# Patient Record
Sex: Female | Born: 1990 | Hispanic: Yes | Marital: Married | Smoking: Never smoker
Health system: Southern US, Community
[De-identification: ages and names within clinical notes are randomized; demographics above are authoritative.]

## PROBLEM LIST (undated history)

## (undated) ENCOUNTER — Inpatient Hospital Stay (HOSPITAL_COMMUNITY): Payer: Self-pay

## (undated) DIAGNOSIS — Z789 Other specified health status: Secondary | ICD-10-CM

## (undated) HISTORY — PX: NO PAST SURGERIES: SHX2092

---

## 2012-04-01 ENCOUNTER — Ambulatory Visit: Payer: Self-pay | Admitting: Physician Assistant

## 2012-04-01 VITALS — BP 108/64 | HR 105 | Temp 98.9°F | Resp 16 | Ht 60.25 in | Wt 115.4 lb

## 2012-04-01 DIAGNOSIS — R05 Cough: Secondary | ICD-10-CM

## 2012-04-01 DIAGNOSIS — J069 Acute upper respiratory infection, unspecified: Secondary | ICD-10-CM

## 2012-04-01 DIAGNOSIS — H6120 Impacted cerumen, unspecified ear: Secondary | ICD-10-CM

## 2012-04-01 MED ORDER — BENZONATATE 100 MG PO CAPS: 100.0000 mg | ORAL_CAPSULE | Freq: Three times a day (TID) | ORAL | Status: DC | PRN

## 2012-04-01 NOTE — Progress Notes (Signed)
   Patient ID: Colleen Ballard MRN: [id number], DOB: 1990/12/17, 22 y.o. Date of Encounter: 04/01/2012, 4:08 PM  Primary Physician: No primary provider on file.  Chief Complaint: Left otalgia and tinnitus for 3 days  HPI: 22 y.o. year old female with history below presents with left otalgia, tinnitus, nasal congestion, and cough for 3 days. Afebrile. No chills. She notes some muffled hearing along the left ear. No drainage, discharge, or bleeding from this ear. She does use Q tips every few days. Right ear is asymptomatic. Cough is non productive and not associated with time of day. No wheezing, shortness of breath, or chest pain. Tinnitus is low pitched and not associated with any dizziness or vertigo symptoms.    History reviewed. No pertinent past medical history.   Home Meds: Prior to Admission medications   Not on File    Allergies: No Known Allergies  History   Social History  . Marital Status: Married    Spouse Name: N/A    Number of Children: N/A  . Years of Education: N/A   Occupational History  . Not on file.   Social History Main Topics  . Smoking status: Never Smoker   . Smokeless tobacco: Not on file  . Alcohol Use: No  . Drug Use: No  . Sexually Active: Yes    Birth Control/ Protection: None   Other Topics Concern  . Not on file   Social History Narrative  . No narrative on file     Review of Systems: Constitutional: negative for chills, fever, night sweats, weight changes, or fatigue  HEENT: see above Cardiovascular: negative for chest pain or palpitations Respiratory: positive for cough. negative for hemoptysis, wheezing, or shortness of breath Abdominal: negative for abdominal pain, nausea, or vomiting Dermatological: negative for rash Neurologic: negative for headache, dizziness, or syncope   Physical Exam: Blood pressure 108/64, pulse 105, temperature 98.9 F (37.2 C), temperature source Oral, resp. rate 16, height 5' 0.25" (1.53  m), weight 115 lb 6.4 oz (52.345 kg), last menstrual period 03/27/2012, SpO2 98.00%., Body mass index is 22.35 kg/(m^2). General: Well developed, well nourished, in no acute distress. Head: Normocephalic, atraumatic, eyes without discharge, sclera non-icteric, nares are without discharge. Bilateral auditory canals with cerumen impaction. S/P ear lavage bilateral auditory canals clear. Bilateral TM's are without perforation, pearly grey and translucent with reflective cone of light bilaterally. No signs of AOM. No mastoid TTP. Oral cavity moist, posterior pharynx without exudate, erythema, peritonsillar abscess, or post nasal drip. Uvula midline.  Neck: Supple. No thyromegaly. Full ROM. No lymphadenopathy. Lungs: Clear bilaterally to auscultation without wheezes, rales, or rhonchi. Breathing is unlabored. Heart: RRR with S1 S2. No murmurs, rubs, or gallops appreciated. Msk:  Strength and tone normal for age. Extremities/Skin: Warm and dry. No clubbing or cyanosis. No edema. No rashes or suspicious lesions. Neuro: Alert and oriented X 3. Moves all extremities spontaneously. Gait is normal. CNII-XII grossly in tact. Psych:  Responds to questions appropriately with a normal affect.   Ear lavage performed by: Eileen Stanford   ASSESSMENT AND PLAN:  22 y.o. year old female with cerumen impaction s/p ear lavage and URI. -Resolved -Ear lavage per above -Tessalon Perles 100 mg 1 po tid prn cough #30 no RF  -RTC prn  Signed, Eula Listen, PA-C 04/01/2012 4:08 PM

## 2013-03-02 LAB — OB RESULTS CONSOLE ABO/RH: RH Type: POSITIVE

## 2013-03-02 LAB — OB RESULTS CONSOLE ANTIBODY SCREEN: Antibody Screen: NEGATIVE

## 2013-03-02 LAB — OB RESULTS CONSOLE RPR: RPR: NONREACTIVE

## 2013-03-02 LAB — OB RESULTS CONSOLE HEPATITIS B SURFACE ANTIGEN: HEP B S AG: NEGATIVE

## 2013-03-02 LAB — OB RESULTS CONSOLE HIV ANTIBODY (ROUTINE TESTING): HIV: NONREACTIVE

## 2013-03-02 LAB — OB RESULTS CONSOLE GC/CHLAMYDIA
Chlamydia: NEGATIVE
Gonorrhea: NEGATIVE

## 2013-03-02 LAB — OB RESULTS CONSOLE RUBELLA ANTIBODY, IGM: RUBELLA: IMMUNE

## 2013-03-31 ENCOUNTER — Other Ambulatory Visit (HOSPITAL_COMMUNITY): Payer: Self-pay | Admitting: Nurse Practitioner

## 2013-03-31 DIAGNOSIS — Z3689 Encounter for other specified antenatal screening: Secondary | ICD-10-CM

## 2013-04-14 ENCOUNTER — Ambulatory Visit (HOSPITAL_COMMUNITY): Payer: MEDICAID

## 2013-04-14 ENCOUNTER — Ambulatory Visit (HOSPITAL_COMMUNITY)
Admission: RE | Admit: 2013-04-14 | Discharge: 2013-04-14 | Disposition: A | Discharge status: Home / Self Care | Payer: Medicaid Other | Source: Ambulatory Visit | Attending: Nurse Practitioner | Admitting: Nurse Practitioner

## 2013-04-14 DIAGNOSIS — Z3689 Encounter for other specified antenatal screening: Secondary | ICD-10-CM | POA: Insufficient documentation

## 2013-08-28 ENCOUNTER — Other Ambulatory Visit (HOSPITAL_COMMUNITY): Payer: Self-pay | Admitting: Nurse Practitioner

## 2013-08-30 ENCOUNTER — Ambulatory Visit (HOSPITAL_COMMUNITY)
Admission: RE | Admit: 2013-08-30 | Discharge: 2013-08-30 | Disposition: A | Discharge status: Home / Self Care | Payer: Medicaid Other | Source: Ambulatory Visit | Attending: Nurse Practitioner | Admitting: Nurse Practitioner

## 2013-08-30 ENCOUNTER — Other Ambulatory Visit (HOSPITAL_COMMUNITY): Payer: Self-pay | Admitting: Nurse Practitioner

## 2013-08-30 DIAGNOSIS — O3660X0 Maternal care for excessive fetal growth, unspecified trimester, not applicable or unspecified: Secondary | ICD-10-CM | POA: Insufficient documentation

## 2013-08-30 DIAGNOSIS — Z3689 Encounter for other specified antenatal screening: Secondary | ICD-10-CM | POA: Insufficient documentation

## 2013-09-11 ENCOUNTER — Other Ambulatory Visit (HOSPITAL_COMMUNITY): Payer: Self-pay | Admitting: Physician Assistant

## 2013-09-11 DIAGNOSIS — O480 Post-term pregnancy: Secondary | ICD-10-CM

## 2013-09-13 ENCOUNTER — Encounter (HOSPITAL_COMMUNITY): Payer: Self-pay | Admitting: *Deleted

## 2013-09-13 ENCOUNTER — Encounter (HOSPITAL_COMMUNITY): Payer: Medicaid Other | Admitting: Anesthesiology

## 2013-09-13 ENCOUNTER — Inpatient Hospital Stay (HOSPITAL_COMMUNITY): Payer: Medicaid Other | Admitting: Anesthesiology

## 2013-09-13 ENCOUNTER — Inpatient Hospital Stay (HOSPITAL_COMMUNITY)
Admission: AD | Admit: 2013-09-13 | Discharge: 2013-09-15 | DRG: 766 | Disposition: A | Discharge status: Home / Self Care | Payer: Medicaid Other | Source: Ambulatory Visit | Attending: Obstetrics and Gynecology | Admitting: Obstetrics and Gynecology

## 2013-09-13 ENCOUNTER — Encounter (HOSPITAL_COMMUNITY)
Admission: AD | Disposition: A | Discharge status: Home / Self Care | Payer: Self-pay | Source: Ambulatory Visit | Attending: Obstetrics and Gynecology

## 2013-09-13 DIAGNOSIS — O330 Maternal care for disproportion due to deformity of maternal pelvic bones: Secondary | ICD-10-CM | POA: Diagnosis present

## 2013-09-13 DIAGNOSIS — O339 Maternal care for disproportion, unspecified: Secondary | ICD-10-CM | POA: Diagnosis present

## 2013-09-13 DIAGNOSIS — O324XX0 Maternal care for high head at term, not applicable or unspecified: Secondary | ICD-10-CM | POA: Diagnosis present

## 2013-09-13 DIAGNOSIS — O479 False labor, unspecified: Secondary | ICD-10-CM | POA: Diagnosis present

## 2013-09-13 DIAGNOSIS — O9902 Anemia complicating childbirth: Secondary | ICD-10-CM | POA: Diagnosis present

## 2013-09-13 DIAGNOSIS — Z98891 History of uterine scar from previous surgery: Secondary | ICD-10-CM

## 2013-09-13 DIAGNOSIS — D649 Anemia, unspecified: Secondary | ICD-10-CM | POA: Diagnosis present

## 2013-09-13 DIAGNOSIS — IMO0001 Reserved for inherently not codable concepts without codable children: Secondary | ICD-10-CM

## 2013-09-13 HISTORY — DX: Other specified health status: Z78.9

## 2013-09-13 LAB — RPR

## 2013-09-13 LAB — CBC
HCT: 35.1 % — ABNORMAL LOW (ref 36.0–46.0)
Hemoglobin: 11.8 g/dL — ABNORMAL LOW (ref 12.0–15.0)
MCH: 30.4 pg (ref 26.0–34.0)
MCHC: 33.6 g/dL (ref 30.0–36.0)
MCV: 90.5 fL (ref 78.0–100.0)
PLATELETS: 191 10*3/uL (ref 150–400)
RBC: 3.88 MIL/uL (ref 3.87–5.11)
RDW: 15.1 % (ref 11.5–15.5)
WBC: 11.5 10*3/uL — AB (ref 4.0–10.5)

## 2013-09-13 LAB — OB RESULTS CONSOLE GBS: GBS: NEGATIVE

## 2013-09-13 LAB — TYPE AND SCREEN
ABO/RH(D): O POS
Antibody Screen: NEGATIVE

## 2013-09-13 LAB — ABO/RH: ABO/RH(D): O POS

## 2013-09-13 SURGERY — Surgical Case
Anesthesia: Epidural | Site: Abdomen

## 2013-09-13 MED ORDER — MENTHOL 3 MG MT LOZG: 1.0000 | LOZENGE | OROMUCOSAL | Status: DC | PRN

## 2013-09-13 MED ORDER — OXYTOCIN BOLUS FROM INFUSION: 500.0000 mL | INTRAVENOUS | Status: DC

## 2013-09-13 MED ORDER — ONDANSETRON HCL 4 MG PO TABS: 4.0000 mg | ORAL_TABLET | ORAL | Status: DC | PRN

## 2013-09-13 MED ORDER — PHENYLEPHRINE 40 MCG/ML (10ML) SYRINGE FOR IV PUSH (FOR BLOOD PRESSURE SUPPORT): 80.0000 ug | PREFILLED_SYRINGE | INTRAVENOUS | Status: DC | PRN

## 2013-09-13 MED ORDER — OXYTOCIN 10 UNIT/ML IJ SOLN
40.0000 [IU] | INTRAVENOUS | Status: DC | PRN
  Administered 2013-09-13: 40 [IU] via INTRAVENOUS

## 2013-09-13 MED ORDER — DIBUCAINE 1 % RE OINT: 1.0000 "application " | TOPICAL_OINTMENT | RECTAL | Status: DC | PRN

## 2013-09-13 MED ORDER — OXYTOCIN 10 UNIT/ML IJ SOLN
INTRAMUSCULAR | Status: AC
  Filled 2013-09-13: qty 4

## 2013-09-13 MED ORDER — NALBUPHINE HCL 10 MG/ML IJ SOLN: 5.0000 mg | INTRAMUSCULAR | Status: DC | PRN

## 2013-09-13 MED ORDER — LACTATED RINGERS IV SOLN: INTRAVENOUS | Status: DC

## 2013-09-13 MED ORDER — FENTANYL CITRATE 0.05 MG/ML IJ SOLN
INTRAMUSCULAR | Status: AC
  Filled 2013-09-13: qty 2

## 2013-09-13 MED ORDER — LANOLIN HYDROUS EX OINT: 1.0000 "application " | TOPICAL_OINTMENT | CUTANEOUS | Status: DC | PRN

## 2013-09-13 MED ORDER — PRENATAL MULTIVITAMIN CH
1.0000 | ORAL_TABLET | Freq: Every day | ORAL | Status: DC
  Administered 2013-09-14 – 2013-09-15 (×2): 1 via ORAL
  Filled 2013-09-13 (×2): qty 1

## 2013-09-13 MED ORDER — PHENYLEPHRINE 8 MG IN D5W 100 ML (0.08MG/ML) PREMIX OPTIME
INJECTION | INTRAVENOUS | Status: DC | PRN
  Administered 2013-09-13: 50 ug/min via INTRAVENOUS

## 2013-09-13 MED ORDER — NALOXONE HCL 0.4 MG/ML IJ SOLN: 0.4000 mg | INTRAMUSCULAR | Status: DC | PRN

## 2013-09-13 MED ORDER — FENTANYL 2.5 MCG/ML BUPIVACAINE 1/10 % EPIDURAL INFUSION (WH - ANES)
INTRAMUSCULAR | Status: DC | PRN
  Administered 2013-09-13: 14 mL/h via EPIDURAL

## 2013-09-13 MED ORDER — ONDANSETRON HCL 4 MG/2ML IJ SOLN: 4.0000 mg | Freq: Three times a day (TID) | INTRAMUSCULAR | Status: DC | PRN

## 2013-09-13 MED ORDER — FENTANYL CITRATE 0.05 MG/ML IJ SOLN: 25.0000 ug | INTRAMUSCULAR | Status: DC | PRN

## 2013-09-13 MED ORDER — DIPHENHYDRAMINE HCL 50 MG/ML IJ SOLN: 12.5000 mg | INTRAMUSCULAR | Status: DC | PRN

## 2013-09-13 MED ORDER — DIPHENHYDRAMINE HCL 25 MG PO CAPS
25.0000 mg | ORAL_CAPSULE | ORAL | Status: DC | PRN
  Filled 2013-09-13: qty 1

## 2013-09-13 MED ORDER — FENTANYL CITRATE 0.05 MG/ML IJ SOLN
INTRAMUSCULAR | Status: DC | PRN
  Administered 2013-09-13: 12.5 ug via INTRATHECAL

## 2013-09-13 MED ORDER — KETOROLAC TROMETHAMINE 30 MG/ML IJ SOLN: 30.0000 mg | Freq: Four times a day (QID) | INTRAMUSCULAR | Status: AC | PRN

## 2013-09-13 MED ORDER — MEASLES, MUMPS & RUBELLA VAC ~~LOC~~ INJ
0.5000 mL | INJECTION | Freq: Once | SUBCUTANEOUS | Status: DC
  Filled 2013-09-13: qty 0.5

## 2013-09-13 MED ORDER — LACTATED RINGERS IV SOLN: 500.0000 mL | INTRAVENOUS | Status: DC | PRN

## 2013-09-13 MED ORDER — SENNOSIDES-DOCUSATE SODIUM 8.6-50 MG PO TABS
2.0000 | ORAL_TABLET | ORAL | Status: DC
  Administered 2013-09-14: 2 via ORAL
  Filled 2013-09-13: qty 2

## 2013-09-13 MED ORDER — LIDOCAINE HCL (PF) 1 % IJ SOLN
30.0000 mL | INTRAMUSCULAR | Status: DC | PRN
  Filled 2013-09-13: qty 30

## 2013-09-13 MED ORDER — ONDANSETRON HCL 4 MG/2ML IJ SOLN
INTRAMUSCULAR | Status: AC
  Filled 2013-09-13: qty 2

## 2013-09-13 MED ORDER — OXYCODONE-ACETAMINOPHEN 5-325 MG PO TABS: 1.0000 | ORAL_TABLET | ORAL | Status: DC | PRN

## 2013-09-13 MED ORDER — IBUPROFEN 600 MG PO TABS: 600.0000 mg | ORAL_TABLET | Freq: Four times a day (QID) | ORAL | Status: DC | PRN

## 2013-09-13 MED ORDER — SODIUM BICARBONATE 8.4 % IV SOLN
INTRAVENOUS | Status: DC | PRN
  Administered 2013-09-13: 10 mL via EPIDURAL

## 2013-09-13 MED ORDER — ONDANSETRON HCL 4 MG/2ML IJ SOLN: 4.0000 mg | INTRAMUSCULAR | Status: DC | PRN

## 2013-09-13 MED ORDER — EPHEDRINE 5 MG/ML INJ
10.0000 mg | INTRAVENOUS | Status: DC | PRN
  Filled 2013-09-13: qty 4

## 2013-09-13 MED ORDER — DIPHENHYDRAMINE HCL 50 MG/ML IJ SOLN: 25.0000 mg | INTRAMUSCULAR | Status: DC | PRN

## 2013-09-13 MED ORDER — CITRIC ACID-SODIUM CITRATE 334-500 MG/5ML PO SOLN
30.0000 mL | ORAL | Status: DC | PRN
  Administered 2013-09-13: 30 mL via ORAL
  Filled 2013-09-13: qty 15

## 2013-09-13 MED ORDER — LACTATED RINGERS IV SOLN
INTRAVENOUS | Status: DC
  Administered 2013-09-13 (×3): via INTRAVENOUS

## 2013-09-13 MED ORDER — OXYTOCIN 40 UNITS IN LACTATED RINGERS INFUSION - SIMPLE MED: 62.5000 mL/h | INTRAVENOUS | Status: AC

## 2013-09-13 MED ORDER — ONDANSETRON HCL 4 MG/2ML IJ SOLN: 4.0000 mg | Freq: Four times a day (QID) | INTRAMUSCULAR | Status: DC | PRN

## 2013-09-13 MED ORDER — LACTATED RINGERS IV SOLN
INTRAVENOUS | Status: DC | PRN
  Administered 2013-09-13: 19:00:00 via INTRAVENOUS

## 2013-09-13 MED ORDER — OXYTOCIN 40 UNITS IN LACTATED RINGERS INFUSION - SIMPLE MED
1.0000 m[IU]/min | INTRAVENOUS | Status: DC
  Administered 2013-09-13: 2 m[IU]/min via INTRAVENOUS

## 2013-09-13 MED ORDER — LACTATED RINGERS IV SOLN
500.0000 mL | Freq: Once | INTRAVENOUS | Status: AC
  Administered 2013-09-13: 500 mL via INTRAVENOUS

## 2013-09-13 MED ORDER — DIPHENHYDRAMINE HCL 25 MG PO CAPS: 25.0000 mg | ORAL_CAPSULE | Freq: Four times a day (QID) | ORAL | Status: DC | PRN

## 2013-09-13 MED ORDER — SIMETHICONE 80 MG PO CHEW
80.0000 mg | CHEWABLE_TABLET | Freq: Three times a day (TID) | ORAL | Status: DC
  Administered 2013-09-14 – 2013-09-15 (×4): 80 mg via ORAL
  Filled 2013-09-13 (×4): qty 1

## 2013-09-13 MED ORDER — SIMETHICONE 80 MG PO CHEW
80.0000 mg | CHEWABLE_TABLET | ORAL | Status: DC
  Administered 2013-09-14: 80 mg via ORAL
  Filled 2013-09-13: qty 1

## 2013-09-13 MED ORDER — TETANUS-DIPHTH-ACELL PERTUSSIS 5-2.5-18.5 LF-MCG/0.5 IM SUSP: 0.5000 mL | Freq: Once | INTRAMUSCULAR | Status: DC

## 2013-09-13 MED ORDER — ACETAMINOPHEN 325 MG PO TABS
650.0000 mg | ORAL_TABLET | ORAL | Status: DC | PRN
Start: 2013-09-13 — End: 2013-09-13

## 2013-09-13 MED ORDER — CEFAZOLIN SODIUM-DEXTROSE 2-3 GM-% IV SOLR
2.0000 g | Freq: Once | INTRAVENOUS | Status: AC
  Administered 2013-09-13: 2 g via INTRAVENOUS
  Filled 2013-09-13: qty 50

## 2013-09-13 MED ORDER — KETOROLAC TROMETHAMINE 60 MG/2ML IM SOLN
60.0000 mg | Freq: Once | INTRAMUSCULAR | Status: AC | PRN
  Administered 2013-09-13: 60 mg via INTRAMUSCULAR

## 2013-09-13 MED ORDER — METOCLOPRAMIDE HCL 5 MG/ML IJ SOLN: 10.0000 mg | Freq: Three times a day (TID) | INTRAMUSCULAR | Status: DC | PRN

## 2013-09-13 MED ORDER — MORPHINE SULFATE (PF) 0.5 MG/ML IJ SOLN
INTRAMUSCULAR | Status: DC | PRN
  Administered 2013-09-13: .1 mg via INTRATHECAL

## 2013-09-13 MED ORDER — MAGNESIUM HYDROXIDE 400 MG/5ML PO SUSP: 30.0000 mL | ORAL | Status: DC | PRN

## 2013-09-13 MED ORDER — PHENYLEPHRINE 8 MG IN D5W 100 ML (0.08MG/ML) PREMIX OPTIME
INJECTION | INTRAVENOUS | Status: AC
  Filled 2013-09-13: qty 100

## 2013-09-13 MED ORDER — KETOROLAC TROMETHAMINE 60 MG/2ML IM SOLN
INTRAMUSCULAR | Status: AC
  Filled 2013-09-13: qty 2

## 2013-09-13 MED ORDER — FENTANYL 2.5 MCG/ML BUPIVACAINE 1/10 % EPIDURAL INFUSION (WH - ANES)
14.0000 mL/h | INTRAMUSCULAR | Status: DC | PRN
  Administered 2013-09-13 (×2): 14 mL/h via EPIDURAL
  Filled 2013-09-13 (×3): qty 125

## 2013-09-13 MED ORDER — LACTATED RINGERS IV SOLN
INTRAVENOUS | Status: DC
  Administered 2013-09-14: 07:00:00 via INTRAVENOUS

## 2013-09-13 MED ORDER — OXYTOCIN 40 UNITS IN LACTATED RINGERS INFUSION - SIMPLE MED
62.5000 mL/h | INTRAVENOUS | Status: DC
  Filled 2013-09-13: qty 1000

## 2013-09-13 MED ORDER — ONDANSETRON HCL 4 MG/2ML IJ SOLN
INTRAMUSCULAR | Status: DC | PRN
  Administered 2013-09-13: 4 mg via INTRAVENOUS

## 2013-09-13 MED ORDER — MEPERIDINE HCL 25 MG/ML IJ SOLN: 6.2500 mg | INTRAMUSCULAR | Status: DC | PRN

## 2013-09-13 MED ORDER — DEXTROSE 5 % IV SOLN
1.0000 ug/kg/h | INTRAVENOUS | Status: DC | PRN
  Filled 2013-09-13: qty 2

## 2013-09-13 MED ORDER — PHENYLEPHRINE 40 MCG/ML (10ML) SYRINGE FOR IV PUSH (FOR BLOOD PRESSURE SUPPORT)
80.0000 ug | PREFILLED_SYRINGE | INTRAVENOUS | Status: DC | PRN
  Filled 2013-09-13: qty 10

## 2013-09-13 MED ORDER — WITCH HAZEL-GLYCERIN EX PADS: 1.0000 "application " | MEDICATED_PAD | CUTANEOUS | Status: DC | PRN

## 2013-09-13 MED ORDER — SCOPOLAMINE 1 MG/3DAYS TD PT72
1.0000 | MEDICATED_PATCH | Freq: Once | TRANSDERMAL | Status: DC
  Administered 2013-09-13: 1.5 mg via TRANSDERMAL

## 2013-09-13 MED ORDER — LIDOCAINE HCL (PF) 1 % IJ SOLN
INTRAMUSCULAR | Status: DC | PRN
  Administered 2013-09-13 (×2): 8 mL

## 2013-09-13 MED ORDER — IBUPROFEN 600 MG PO TABS
600.0000 mg | ORAL_TABLET | Freq: Four times a day (QID) | ORAL | Status: DC
  Administered 2013-09-14 – 2013-09-15 (×6): 600 mg via ORAL
  Filled 2013-09-13 (×6): qty 1

## 2013-09-13 MED ORDER — PHENYLEPHRINE 8 MG IN D5W 100 ML (0.08MG/ML) PREMIX OPTIME: INJECTION | INTRAVENOUS | Status: DC | PRN

## 2013-09-13 MED ORDER — EPHEDRINE 5 MG/ML INJ: 10.0000 mg | INTRAVENOUS | Status: DC | PRN

## 2013-09-13 MED ORDER — SODIUM CHLORIDE 0.9 % IJ SOLN: 3.0000 mL | INTRAMUSCULAR | Status: DC | PRN

## 2013-09-13 MED ORDER — BUPIVACAINE IN DEXTROSE 0.75-8.25 % IT SOLN
INTRATHECAL | Status: DC | PRN
  Administered 2013-09-13 (×2): .8 mL via INTRATHECAL

## 2013-09-13 MED ORDER — SIMETHICONE 80 MG PO CHEW: 80.0000 mg | CHEWABLE_TABLET | ORAL | Status: DC | PRN

## 2013-09-13 MED ORDER — MORPHINE SULFATE 0.5 MG/ML IJ SOLN
INTRAMUSCULAR | Status: AC
  Filled 2013-09-13: qty 10

## 2013-09-13 MED ORDER — TERBUTALINE SULFATE 1 MG/ML IJ SOLN: 0.2500 mg | Freq: Once | INTRAMUSCULAR | Status: DC | PRN

## 2013-09-13 MED ORDER — ZOLPIDEM TARTRATE 5 MG PO TABS: 5.0000 mg | ORAL_TABLET | Freq: Every evening | ORAL | Status: DC | PRN

## 2013-09-13 MED ORDER — SCOPOLAMINE 1 MG/3DAYS TD PT72
MEDICATED_PATCH | TRANSDERMAL | Status: AC
  Filled 2013-09-13: qty 1

## 2013-09-13 SURGICAL SUPPLY — 32 items
BENZOIN TINCTURE PRP APPL 2/3 (GAUZE/BANDAGES/DRESSINGS) IMPLANT
CLAMP CORD UMBIL (MISCELLANEOUS) IMPLANT
CLOSURE WOUND 1/2 X4 (GAUZE/BANDAGES/DRESSINGS) ×1
CLOTH BEACON ORANGE TIMEOUT ST (SAFETY) ×3 IMPLANT
DRAPE LG THREE QUARTER DISP (DRAPES) IMPLANT
DRSG OPSITE POSTOP 4X10 (GAUZE/BANDAGES/DRESSINGS) ×3 IMPLANT
DURAPREP 26ML APPLICATOR (WOUND CARE) ×3 IMPLANT
ELECT REM PT RETURN 9FT ADLT (ELECTROSURGICAL) ×3
ELECTRODE REM PT RTRN 9FT ADLT (ELECTROSURGICAL) ×1 IMPLANT
EXTRACTOR VACUUM KIWI (MISCELLANEOUS) ×3 IMPLANT
GLOVE BIO SURGEON ST LM GN SZ9 (GLOVE) ×3 IMPLANT
GLOVE BIOGEL PI IND STRL 9 (GLOVE) ×1 IMPLANT
GLOVE BIOGEL PI INDICATOR 9 (GLOVE) ×2
GOWN STRL REUS W/TWL 2XL LVL3 (GOWN DISPOSABLE) ×3 IMPLANT
GOWN STRL REUS W/TWL LRG LVL3 (GOWN DISPOSABLE) ×3 IMPLANT
NEEDLE HYPO 25X5/8 SAFETYGLIDE (NEEDLE) IMPLANT
NS IRRIG 1000ML POUR BTL (IV SOLUTION) ×3 IMPLANT
PACK C SECTION WH (CUSTOM PROCEDURE TRAY) ×3 IMPLANT
PAD OB MATERNITY 4.3X12.25 (PERSONAL CARE ITEMS) ×3 IMPLANT
RTRCTR C-SECT PINK 25CM LRG (MISCELLANEOUS) ×3 IMPLANT
STRIP CLOSURE SKIN 1/2X4 (GAUZE/BANDAGES/DRESSINGS) ×2 IMPLANT
SUT MNCRL 0 VIOLET CTX 36 (SUTURE) ×2 IMPLANT
SUT MONOCRYL 0 CTX 36 (SUTURE) ×4
SUT VIC AB 0 CT1 27 (SUTURE) ×4
SUT VIC AB 0 CT1 27XBRD ANBCTR (SUTURE) ×2 IMPLANT
SUT VIC AB 2-0 CT1 27 (SUTURE) ×4
SUT VIC AB 2-0 CT1 TAPERPNT 27 (SUTURE) ×2 IMPLANT
SUT VIC AB 4-0 KS 27 (SUTURE) ×3 IMPLANT
SYR BULB IRRIGATION 50ML (SYRINGE) IMPLANT
TOWEL OR 17X24 6PK STRL BLUE (TOWEL DISPOSABLE) ×3 IMPLANT
TRAY FOLEY CATH 14FR (SET/KITS/TRAYS/PACK) ×3 IMPLANT
WATER STERILE IRR 1000ML POUR (IV SOLUTION) ×3 IMPLANT

## 2013-09-13 NOTE — Anesthesia Preprocedure Evaluation (Signed)
Anesthesia Evaluation  Patient identified by MRN, date of birth, ID band Patient awake    Reviewed: Allergy & Precautions, H&P , NPO status , Patient's Chart, lab work & pertinent test results  Airway Mallampati: II TM Distance: >3 FB Neck ROM: full    Dental no notable dental hx.    Pulmonary neg pulmonary ROS,    Pulmonary exam normal       Cardiovascular negative cardio ROS      Neuro/Psych negative neurological ROS  negative psych ROS   GI/Hepatic negative GI ROS, Neg liver ROS,   Endo/Other  negative endocrine ROS  Renal/GU negative Renal ROS     Musculoskeletal   Abdominal Normal abdominal exam  (+)   Peds  Hematology negative hematology ROS (+)   Anesthesia Other Findings   Reproductive/Obstetrics (+) Pregnancy                           Anesthesia Physical Anesthesia Plan  ASA: II  Anesthesia Plan: Epidural   Post-op Pain Management:    Induction:   Airway Management Planned:   Additional Equipment:   Intra-op Plan:   Post-operative Plan:   Informed Consent: I have reviewed the patients History and Physical, chart, labs and discussed the procedure including the risks, benefits and alternatives for the proposed anesthesia with the patient or authorized representative who has indicated his/her understanding and acceptance.     Plan Discussed with:   Anesthesia Plan Comments:         Anesthesia Quick Evaluation  

## 2013-09-13 NOTE — Progress Notes (Signed)
Colleen Ballard is a 23 y.o. G1P0 at 10967w6d  admitted for labor  Subjective: Very slow progression but poor labor pattern with irregular contractions every 4-10 min.  +FM. Very little pressure but numb.    Objective: BP 127/80  Pulse 100  Temp(Src) 99.4 F (37.4 C) (Oral)  Resp 20  Ht 5' (1.524 m)  Wt 72.179 kg (159 lb 2 oz)  BMI 31.08 kg/m2  SpO2 98%      FHT:  FHR: 145 bpm, variability: moderate,  accelerations:  Abscent,  decelerations:  Present variable UC:   irregular, every 2-6 minutes SVE:   Dilation: 10 Effacement (%): 100 Station: +1 Exam by:: Dr. Reola CalkinsBeck  Labs: Lab Results  Component Value Date   WBC 11.5* 09/13/2013   HGB 11.8* 09/13/2013   HCT 35.1* 09/13/2013   MCV 90.5 09/13/2013   PLT 191 09/13/2013    Assessment / Plan: protracted active phase but with poor contraction pattern.  now complete  Labor: will start pushing now  Fetal Wellbeing:  Category II Pain Control:  Epidural I/D:  n/a Anticipated MOD:  NSVD as long as able to make progress pushing.    BECK, KELI L 09/13/2013, 4:02 PM

## 2013-09-13 NOTE — Anesthesia Postprocedure Evaluation (Signed)
  Anesthesia Post-op Note  Patient: Colleen Ballard  Procedure(s) Performed: Procedure(s) (LRB): CESAREAN SECTION (N/A)  Patient Location: PACU  Anesthesia Type: Spinal  Level of Consciousness: awake and alert   Airway and Oxygen Therapy: Patient Spontanous Breathing  Post-op Pain: mild  Post-op Assessment: Post-op Vital signs reviewed, Patient's Cardiovascular Status Stable, Respiratory Function Stable, Patent Airway and No signs of Nausea or vomiting  Last Vitals:  Filed Vitals:   09/13/13 2145  BP: 143/39  Pulse: 104  Temp:   Resp: 19    Post-op Vital Signs: stable   Complications: No apparent anesthesia complications

## 2013-09-13 NOTE — Progress Notes (Signed)
Colleen Ballard is a 23 y.o. G1P0 at 3471w6d admitted for active labor  Subjective: Comfortable with epidural  Objective: BP 132/79  Pulse 83  Temp(Src) 98.2 F (36.8 C) (Oral)  Resp 18  Ht 5' (1.524 m)  Wt 72.179 kg (159 lb 2 oz)  BMI 31.08 kg/m2  SpO2 98%      FHT:  FHR: 120s bpm, variability: moderate,  accelerations:  Present,  decelerations:  Absent UC:   regular, every 2-5 minutes SVE:   Dilation: 7 Effacement (%): 90 Station: -1 Exam by:: AvnetFarmer RN  Labs: Lab Results  Component Value Date   WBC 11.5* 09/13/2013   HGB 11.8* 09/13/2013   HCT 35.1* 09/13/2013   MCV 90.5 09/13/2013   PLT 191 09/13/2013    Assessment / Plan: Spontaneous labor, progressing normally  Labor: expectant management Preeclampsia:  no signs or symptoms of toxicity Fetal Wellbeing:  Category I Pain Control:  Epidural I/D:  n/a Anticipated MOD:  NSVD  ODOM, MICHAEL RYAN 09/13/2013, 5:15 AM

## 2013-09-13 NOTE — Progress Notes (Signed)
Colleen Ballard is a 23 y.o. G1P0 at 776w6d admitted for active labor  Subjective: Feeling pain but no pressure.   Objective: BP 129/74  Pulse 97  Temp(Src) 98.4 F (36.9 C) (Oral)  Resp 18  Ht 5' (1.524 m)  Wt 72.179 kg (159 lb 2 oz)  BMI 31.08 kg/m2  SpO2 98%      FHT:  FHR: 140 bpm, variability: moderate,  accelerations:  Present,  decelerations:  Present variable4UC:   regular, every 4 minutes SVE:  Dilation: 9.5 Effacement: 100 Station: +1 Exam by: Dr. Jordan LikesSchmitz   Labs: Lab Results  Component Value Date   WBC 11.5* 09/13/2013   HGB 11.8* 09/13/2013   HCT 35.1* 09/13/2013   MCV 90.5 09/13/2013   PLT 191 09/13/2013    Assessment / Plan: Spontaneous labor, progressing normally  Labor: progresssing spontaneously  Fetal Wellbeing:  Category II Pain Control:  Epidural I/D:  GBS neg Anticipated MOD:  NSVD  Myra RudeSchmitz, Jeremy E 09/13/2013, 10:16 AM

## 2013-09-13 NOTE — Op Note (Signed)
`````  Attestation of Attending Supervision of Advanced Practitioner: Evaluation and management procedures were performed by the PA/NP/CNM/OB Fellow under my supervision/collaboration. Chart reviewed and agree with management and plan.  Jahshua Bonito V 09/13/2013 11:36 PM    

## 2013-09-13 NOTE — Progress Notes (Signed)
Debbera Pleas KochMartinez-Villafana is a 23 y.o. G1P0 at 7434w6d admitted for active labor  Subjective: Feeling back pain. No pressure.   Objective: BP 140/88  Pulse 95  Temp(Src) 99.5 F (37.5 C) (Oral)  Resp 20  Ht 5' (1.524 m)  Wt 72.179 kg (159 lb 2 oz)  BMI 31.08 kg/m2  SpO2 98%      FHT:  FHR: 145 bpm, variability: moderate,  accelerations:  Present,  decelerations:  Absent UC:   regular, every 4 minutes SVE:   Dilation: 9 Effacement (%): 100 Station: 0 Exam by:: Raliegh Ipatherine Stout RN  Labs: Lab Results  Component Value Date   WBC 11.5* 09/13/2013   HGB 11.8* 09/13/2013   HCT 35.1* 09/13/2013   MCV 90.5 09/13/2013   PLT 191 09/13/2013    Assessment / Plan: Progressing   Labor: Cervix unchanged from previous check, Start pit.  Fetal Wellbeing:  Category II Pain Control:  Epidural I/D:  n/a Anticipated MOD:  NSVD  Myra RudeSchmitz, Jeremy E 09/13/2013, 12:19 PM

## 2013-09-13 NOTE — H&P (Signed)
Colleen Ballard is a 23 y.o. female G2P0 with IUP at 6467w6d presenting for SOOL, Painful ctx since Monday becoming more frequent and painful. Spacing out since arriving in MAU. + spotting vaginal bleeding.  Membranes are intact, with active fetal movement.   PNCare at HD since 12/4 wks  Prenatal History/Complications: Borderline MSAFP Anemia ESL -spanish  Past Medical History: Past Medical History  Diagnosis Date  . Medical history non-contributory     Past Surgical History: Past Surgical History  Procedure Laterality Date  . No past surgeries      Obstetrical History: OB History   Grav Para Term Preterm Abortions TAB SAB Ect Mult Living   2               Gynecological History: OB History   Grav Para Term Preterm Abortions TAB SAB Ect Mult Living   2               Social History: History   Social History  . Marital Status: Married    Spouse Name: N/A    Number of Children: N/A  . Years of Education: N/A   Social History Main Topics  . Smoking status: Never Smoker   . Smokeless tobacco: None  . Alcohol Use: No  . Drug Use: No  . Sexual Activity: Yes    Birth Control/ Protection: None   Other Topics Concern  . None   Social History Narrative  . None    Family History: Family History  Problem Relation Age of Onset  . Hyperlipidemia Father   . Diabetes Maternal Grandmother     Allergies: No Known Allergies  Prescriptions prior to admission  Medication Sig Dispense Refill  . benzonatate (TESSALON) 100 MG capsule Take 1 capsule (100 mg total) by mouth 3 (three) times daily as needed for cough.  30 capsule  0     Review of Systems   Constitutional: NO complaints besides painful ctx. Denies HA, vision change, ruq pain.  Blood pressure 135/86, pulse 83, temperature 98.4 F (36.9 C), temperature source Oral, resp. rate 18, height 5' (1.524 m), weight 72.179 kg (159 lb 2 oz). General appearance: alert, cooperative, appears stated age and  no distress Lungs: clear to auscultation bilaterally Heart: regular rate and rhythm Abdomen: soft, non-tender; bowel sounds normal Pelvic: significant caput Extremities: Homans sign is negative, no sign of DVT Presentation: cephalic Fetal monitoringBaseline: 140s bpm, Variability: Good {> 6 bpm), Accelerations: Reactive and Decelerations: Absent Uterine activity irregular q3-7 Dilation: 7 Effacement (%): 100 Station: +2 Exam by:: Dr. Ike Benedom   Prenatal labs: ABO, Rh:   0+ Antibody:  Neg  Rubella:  Immune RPR:   Neg HBsAg:   Neg HIV:   NR GBS: Negative (06/17 0000)  Elevated 1hr 152 3Hr 88/124/150/90  Prenatal Transfer Tool  Maternal Diabetes: No Genetic Screening: Normal Maternal Ultrasounds/Referrals: Normal Fetal Ultrasounds or other Referrals:  None Maternal Substance Abuse:  No Significant Maternal Medications:  None Significant Maternal Lab Results: Lab values include: Group B Strep negative     Results for orders placed during the hospital encounter of 09/13/13 (from the past 24 hour(s))  OB RESULTS CONSOLE GBS   Collection Time    09/13/13 12:00 AM      Result Value Ref Range   GBS Negative      Assessment: Colleen Ballard is a 23 y.o. G2P0 at 3567w6d by L=19 here for SOOL #Labor: pt is making change will expectantly manage at this time #Pain: Desires Epidural  #  FWB: Cat I #ID:  GBS NEg #MOF: Breast #MOC: Undecided - desires 2-3 yrs between children  ODOM, MICHAEL RYAN 09/13/2013, 3:05 AM

## 2013-09-13 NOTE — Progress Notes (Signed)
Natalynn Pleas KochMartinez-Villafana is a 23 y.o. G1P0 at 3820w6d  admitted for active labor  Subjective:The patient has progressed slowly to C/C/+1 with vertex in ROA by u/s but has not had significant descent in 3+ hours of pushing, patient is "done" with no interest in trying further. The baby is not at a station to consider offering Vacuum assistance.  Cesarean section recommended and readily accepted, risks and potential complications reviewed. Pt questions answered, and pt signs consent.  Will proceed toward primary cesarean section for CPD.   Objective: BP 135/72  Pulse 88  Temp(Src) 98.2 F (36.8 C) (Oral)  Resp 18  Ht 5' (1.524 m)  Wt 159 lb 2 oz (72.179 kg)  BMI 31.08 kg/m2  SpO2 98%      FHT:  FHR: 140 bpm, variability: moderate,  accelerations:  Present,  decelerations:  Absent UC:   regular, every 2-4 minutes SVE:   Dilation: 10 Effacement (%): 100 Station: +1 Exam by:: Dr. Reola CalkinsBeck  Labs: Lab Results  Component Value Date   WBC 11.5* 09/13/2013   HGB 11.8* 09/13/2013   HCT 35.1* 09/13/2013   MCV 90.5 09/13/2013   PLT 191 09/13/2013    Assessment / Plan: Arrest in active phase of labor  Labor: arrest of descent Preeclampsia:   Fetal Wellbeing:  Category I Pain Control:  Epidural I/D:  n/a Anticipated MOD:  Cesarean SEction  OR NOTIFIED, AND WILL PROCEED PROMPTLY TO CESAREAN SECTION.  FERGUSON,JOHN V 09/13/2013, 6:56 PM

## 2013-09-13 NOTE — Anesthesia Procedure Notes (Addendum)
Epidural Patient location during procedure: OB Start time: 09/13/2013 4:25 AM End time: 09/13/2013 4:29 AM  Staffing Anesthesiologist: Lyn Hollingshead Performed by: anesthesiologist   Preanesthetic Checklist Completed: patient identified, surgical consent, pre-op evaluation, timeout performed, IV checked, risks and benefits discussed and monitors and equipment checked  Epidural Patient position: sitting Prep: site prepped and draped and DuraPrep Patient monitoring: continuous pulse ox and blood pressure Approach: midline Location: L3-L4 Injection technique: LOR air  Needle:  Needle type: Tuohy  Needle gauge: 17 G Needle length: 9 cm and 9 Needle insertion depth: 5 cm cm Catheter type: closed end flexible Catheter size: 19 Gauge Catheter at skin depth: 10 cm Test dose: negative and Other  Assessment Sensory level: T9 Events: blood not aspirated, injection not painful, no injection resistance, negative IV test and no paresthesia  Additional Notes Reason for block:procedure for pain  Spinal  Patient location during procedure: OR Staffing Anesthesiologist: Montez Hageman Performed by: anesthesiologist  Preanesthetic Checklist Completed: patient identified, site marked, surgical consent, pre-op evaluation, timeout performed, IV checked, risks and benefits discussed and monitors and equipment checked Spinal Block Patient position: sitting Prep: Betadine Patient monitoring: heart rate, continuous pulse ox and blood pressure Approach: midline Location: L2-3 Injection technique: single-shot Needle Needle type: Sprotte  Needle gauge: 24 G Needle length: 9 cm Additional Notes Expiration date of kit checked and confirmed. Patient tolerated procedure well, without complications.  Epidural inadequate for C/S.   Spinal  Patient location during procedure: OR Start time: 09/13/2013 7:46 PM Staffing Anesthesiologist: Montez Hageman Performed by: anesthesiologist   Preanesthetic Checklist Completed: patient identified, site marked, surgical consent, pre-op evaluation, timeout performed, IV checked, risks and benefits discussed and monitors and equipment checked Spinal Block Patient position: sitting Prep: Betadine Patient monitoring: heart rate, continuous pulse ox and blood pressure Approach: midline Location: L4-5 Injection technique: single-shot Needle Needle type: Sprotte  Needle gauge: 24 G Needle length: 9 cm Additional Notes Expiration date of kit checked and confirmed. Patient tolerated procedure well, without complications.  Repeat SAB. First one inadequate level. Repeated

## 2013-09-13 NOTE — Op Note (Signed)
Elizabeht Ballard PROCEDURE DATE: 09/13/2013  PREOPERATIVE DIAGNOSES: Intrauterine pregnancy at  5326w6d weeks gestation; failure to descend  POSTOPERATIVE DIAGNOSES: The same  PROCEDURE: Primary Low Transverse Cesarean Section  SURGEON:  Dr. Christin BachJohn Ferguson  ASSISTANT:  Dr. Rulon AbideKeli Regana Kemple  INDICATIONS: Colleen Ballard is a 23 y.o. G1P1001 at 3626w6d here for cesarean section secondary to the indications listed under preoperative diagnoses; please see preoperative note for further details.  The risks of cesarean section were discussed with the patient including but were not limited to: bleeding which may require transfusion or reoperation; infection which may require antibiotics; injury to bowel, bladder, ureters or other surrounding organs; injury to the fetus; need for additional procedures including hysterectomy in the event of a life-threatening hemorrhage; placental abnormalities wth subsequent pregnancies, incisional problems, thromboembolic phenomenon and other postoperative/anesthesia complications.   The patient concurred with the proposed plan, giving informed written consent for the procedure.    FINDINGS:  Viable female infant in cephalic presentation. Direct OP presentation.   Apgars 8 and 9.  Clear amniotic fluid.  Intact placenta, three vessel cord.  Normal uterus, fallopian tubes and ovaries bilaterally.  ANESTHESIA: epidural and then Spinal INTRAVENOUS FLUIDS: 1000 ml ESTIMATED BLOOD LOSS: 500 ml URINE OUTPUT:  300 ml SPECIMENS: Placenta sent to L&D COMPLICATIONS: None immediate  PROCEDURE IN DETAIL:  The patient preoperatively received intravenous antibiotics and had sequential compression devices applied to her lower extremities.  She was then taken to the operating room where spinal anesthesia was administered after failure to achieve adequate anesthesia while dosing her epidural and was found to be adequate. She was then placed in a dorsal supine position with a  leftward tilt, and prepped and draped in a sterile manner.  A foley catheter was placed into her bladder and attached to constant gravity.  After an adequate timeout was performed, a Pfannenstiel skin incision was made with scalpel and carried through to the underlying layer of fascia. The fascia was incised in the midline, and this incision was extended bilaterally using the Mayo scissors.  Kocher clamps were applied to the superior aspect of the fascial incision and the underlying rectus muscles were dissected off bluntly. A similar process was carried out on the inferior aspect of the fascial incision. The rectus muscles were separated in the midline bluntly and the peritoneum was entered bluntly. Attention was turned to the lower uterine segment where a low transverse hysterotomy was made with a scalpel and extended bilaterally bluntly.  The infant was successfully delivered with assistance from a nurse pushing from below.  The cord was clamped and cut and the infant was handed over to awaiting neonatology team. Cord pH 7.34. Uterine massage was then administered, and the placenta delivered intact with a three-vessel cord. The uterus was then cleared of clot and debris.  Bilateral downward extensions were noted.  The hysterotomy was closed with 0 monocryl in a running locked fashion, and an imbricating layer was also placed with 0 monocryl. The extensions were able to be closed in the same running suture without difficulty.  The pelvis was cleared of all clot and debris. Hemostasis was confirmed on all surfaces.  The peritoneum and the muscles were reapproximated using 0 Vicryl running stitches. The fascia was then closed using 0 Vicryl in a running fashion.  The subcutaneous layer was irrigated. The skin was closed with a 4-0 Vicryl subcuticular stitch. The patient tolerated the procedure well. Sponge, lap, instrument and needle counts were correct x 2.  She was taken  to the recovery room in stable condition.     Cecilia Vancleve, Redmond BasemanKELI L, MD

## 2013-09-13 NOTE — MAU Note (Signed)
Pt states she has been having contractions since Monday, with spotting

## 2013-09-13 NOTE — Progress Notes (Signed)
Colleen Ballard is a 23 y.o. G1P0 at 1570w6d admitted for active labor  Subjective: Comfortable with epidural, no complaints  Objective: BP 121/85  Pulse 93  Temp(Src) 98.4 F (36.9 C) (Oral)  Resp 18  Ht 5' (1.524 m)  Wt 72.179 kg (159 lb 2 oz)  BMI 31.08 kg/m2  SpO2 98%      FHT:  FHR: 140s bpm, variability: moderate,  accelerations:  Present,  decelerations:  1 variable decel UC:   regular, every 2-5 minutes SVE:   Dilation: 8 Effacement (%): 90 Station: +1 Exam by:: Dr. Ike Benedom  Labs: Lab Results  Component Value Date   WBC 11.5* 09/13/2013   HGB 11.8* 09/13/2013   HCT 35.1* 09/13/2013   MCV 90.5 09/13/2013   PLT 191 09/13/2013    Assessment / Plan: Spontaneous labor, progressing normally  Labor: expectant management, if no change at next exam . Start pit Preeclampsia:  no signs or symptoms of toxicity Fetal Wellbeing:  Category II Pain Control:  Epidural I/D:  n/a Anticipated MOD:  NSVD  ODOM, MICHAEL RYAN 09/13/2013, 7:57 AM

## 2013-09-13 NOTE — Transfer of Care (Signed)
Immediate Anesthesia Transfer of Care Note  Patient: Colleen Ballard  Procedure(s) Performed: Procedure(s): CESAREAN SECTION (N/A)  Patient Location: PACU  Anesthesia Type:Spinal and Epidural  Level of Consciousness: awake, alert , oriented and patient cooperative  Airway & Oxygen Therapy: Patient Spontanous Breathing  Post-op Assessment: Report given to PACU RN, Post -op Vital signs reviewed and stable and Patient moving all extremities X 4  Post vital signs: Reviewed and stable  Complications: No apparent anesthesia complications

## 2013-09-13 NOTE — MAU Note (Signed)
PT  SAYS  SHE HURT BAD  AT 8 PM,  WITH BLOODY SHOW.   DENIES HSV AND MRSA.   DOESN'T  KNOW GBS.

## 2013-09-14 ENCOUNTER — Encounter (HOSPITAL_COMMUNITY): Payer: Self-pay | Admitting: Obstetrics and Gynecology

## 2013-09-14 LAB — CBC
HEMATOCRIT: 27.8 % — AB (ref 36.0–46.0)
Hemoglobin: 9.2 g/dL — ABNORMAL LOW (ref 12.0–15.0)
MCH: 30.2 pg (ref 26.0–34.0)
MCHC: 33.1 g/dL (ref 30.0–36.0)
MCV: 91.1 fL (ref 78.0–100.0)
PLATELETS: 149 10*3/uL — AB (ref 150–400)
RBC: 3.05 MIL/uL — ABNORMAL LOW (ref 3.87–5.11)
RDW: 15.5 % (ref 11.5–15.5)
WBC: 13 10*3/uL — AB (ref 4.0–10.5)

## 2013-09-14 NOTE — Progress Notes (Signed)
I have seen and examined this patient and I agree with the above. Cam HaiSHAW, KIMBERLY CNM 7:49 AM 09/14/2013

## 2013-09-14 NOTE — Lactation Note (Signed)
This note was copied from the chart of Colleen Ballard. Lactation Consultation Note  Patient Name: Colleen Ballard Today's Date: 09/14/2013 Reason for consult: Initial assessment;Difficult latch. Mom is primipara and has been having latch difficulty with her newborn, almost 6424 hours old.  She can express colostrum and baby can be stimulated to suck normally on gloved finger but quickly becomes frustrated when trying to latch to breast.  Mom has firm, upright breasts and short nipples, so #20 NS used and latch achieved with intermittent sucking, total latch time about 15 minutes but only a few effective sucking bursts.  LC removed baby briefly from breast and saw a few drops of colostrum inside NS but baby became fussy and re-latched quickly and seemed more eager, but again fell asleep.  Parents shown ways to stimulate baby and perform intermittent breast compressions.  FOB at bedside and being supportive but both parents are exhibiting anxious new parent behavior.  LC encouraged frequent STS and cue feedings, as well as careful handling of baby's swollen scalp which could be contributing to her fussiness.Mom encouraged to feed baby 8-12 times/24 hours and with feeding cues. LC encouraged review of Baby and Me pp 9, 14 and 20-25 for STS and BF information. LC provided [employer] Resource brochure and reviewed [facility] and list of community and web site resources.      Maternal Data Formula Feeding for Exclusion: No Infant to breast within first hour of birth: Yes Has patient been taught Hand Expression?: Yes (by her nurse and LC) Does the patient have breastfeeding experience prior to this delivery?: No  Feeding Feeding Type: Breast Fed Nipple Type: Other (own bottle nipple, avent) Length of feed: 15 min  LATCH Score/Interventions Latch: Repeated attempts needed to sustain latch, nipple held in mouth throughout feeding, stimulation needed to elicit sucking  reflex. Intervention(s): Adjust position;Assist with latch;Breast compression  Audible Swallowing: A few with stimulation Intervention(s): Skin to skin;Alternate breast massage  Type of Nipple: Everted at rest and after stimulation (nipples are short but everted)  Comfort (Breast/Nipple): Soft / non-tender     Hold (Positioning): Assistance needed to correctly position infant at breast and maintain latch. Intervention(s): Breastfeeding basics reviewed;Support Pillows;Position options;Skin to skin  LATCH Score: 7 (with LC, using #20 NS)  Lactation Tools Discussed/Used Tools: Nipple Shields Nipple shield size: 20 STS, cue feedings, hand expression, calming and stimulation techniques  Consult Status Consult Status: Follow-up Date: 09/15/13 Follow-up type: In-patient    Warrick ParisianBryant, Joanne [facility] 09/14/2013, 6:04 PM

## 2013-09-14 NOTE — Progress Notes (Signed)
UR chart review completed.  

## 2013-09-14 NOTE — Addendum Note (Signed)
Addendum created 09/14/13 0810 by Turner DanielsJennifer L Soren Pigman, CRNA   Modules edited: Notes Section   Notes Section:  File: [id number]

## 2013-09-14 NOTE — Anesthesia Postprocedure Evaluation (Signed)
  Anesthesia Post-op Note  Anesthesia Post Note  Patient: Colleen Ballard  Procedure(s) Performed: Procedure(s) (LRB): CESAREAN SECTION (N/A)  Anesthesia type: Epidural  Patient location: Mother/Baby  Post pain: Pain level controlled  Post assessment: Post-op Vital signs reviewed  Last Vitals:  Filed Vitals:   09/14/13 0600  BP: 108/73  Pulse: 93  Temp: 37.2 C  Resp: 18    Post vital signs: Reviewed  Level of consciousness:alert  Complications: No apparent anesthesia complications

## 2013-09-14 NOTE — Progress Notes (Signed)
Subjective: Postpartum Day 1: Cesarean Delivery Patient reports incisional pain, tolerating PO and + flatus.    Objective: Vital signs in last 24 hours: Temp:  [97.9 F (36.6 C)-99.5 F (37.5 C)] 98.9 F (37.2 C) (06/18 0600) Pulse Rate:  [85-104] 93 (06/18 0600) Resp:  [18-30] 18 (06/18 0600) BP: (102-145)/(39-90) 108/73 mmHg (06/18 0600) SpO2:  [95 %-98 %] 97 % (06/18 0600)  Physical Exam:  General: alert, cooperative and no distress Lochia: appropriate Uterine Fundus: firm Incision: no significant drainage DVT Evaluation: No evidence of DVT seen on physical exam.   Recent Labs  09/13/13 0300 09/14/13 0624  HGB 11.8* 9.2*  HCT 35.1* 27.8*    Assessment/Plan: Status post Cesarean section. Doing well postoperatively.  Continue current care.  Breast feeding Undecided about contraception.   Myra RudeSchmitz, Jeremy E 09/14/2013, 7:32 AM

## 2013-09-15 ENCOUNTER — Ambulatory Visit (HOSPITAL_COMMUNITY): Payer: Medicaid Other

## 2013-09-15 MED ORDER — SENNOSIDES-DOCUSATE SODIUM 8.6-50 MG PO TABS: 2.0000 | ORAL_TABLET | ORAL | Status: DC

## 2013-09-15 MED ORDER — IBUPROFEN 600 MG PO TABS: 600.0000 mg | ORAL_TABLET | Freq: Four times a day (QID) | ORAL | Status: DC

## 2013-09-15 MED ORDER — OXYCODONE-ACETAMINOPHEN 5-325 MG PO TABS: 1.0000 | ORAL_TABLET | ORAL | Status: DC | PRN

## 2013-09-15 NOTE — Discharge Summary (Signed)
  Obstetric Discharge Summary Reason for Admission: onset of labor Prenatal Procedures: none Intrapartum Procedures: cesarean: low cervical, transverse for failure to descend Postpartum Procedures: none Complications-Operative and Postpartum: none  Hospital Course: Admitted on 6/17 and progressed to complete and pushed. Failure to descend. Moved to section and delivered succsessfully. See Op note for details. (LTCS). Pt is candidate for TOLAC with next pregnancy. Pt PP is meeting all milestones. Undecided for contraception and is breast feeding. Encouraged LARC and follow up at HD in 4wks.   H/H: Lab Results  Component Value Date/Time   HGB 9.2* 09/14/2013  6:24 AM   HCT 27.8* 09/14/2013  6:24 AM    Filed Vitals:   09/15/13 0545  BP: 101/65  Pulse: 87  Temp: 98.7 F (37.1 C)  Resp: 12    Physical Exam: VSS NAD Abd: Appropriately tender, ND, Fundus @U -3 Incision: c/d/i No c/c/e, Neg homan's sign, neg cords Lochia Appropriate  Discharge Diagnoses: Term Pregnancy-delivered  Discharge Information: Date: 10/09/2010 Activity: pelvic rest Diet: routine  Medications: PNV, Ibuprofen and Percocet Breast feeding:  Yes Condition: stable Instructions: refer to handout Discharge to: home      Medication List         ibuprofen 600 MG tablet  Commonly known as:  ADVIL,MOTRIN  Take 1 tablet (600 mg total) by mouth every 6 (six) hours.     oxyCODONE-acetaminophen 5-325 MG per tablet  Commonly known as:  PERCOCET/ROXICET  Take 1-2 tablets by mouth every 4 (four) hours as needed for severe pain (moderate - severe pain).     prenatal multivitamin Tabs tablet  Take 1 tablet by mouth daily at 12 noon.     senna-docusate 8.6-50 MG per tablet  Commonly known as:  Senokot-S  Take 2 tablets by mouth daily.           Follow-up Information   [facility] [place] Schedule an appointment as soon as possible for a visit in 4 weeks. (For Postpartum Visit and  contraception)    Contact information:   [address] [place] [id number] [phone]      ODOM, MICHAEL RYAN 09/15/2013,8:48 AM

## 2013-09-15 NOTE — H&P (Signed)
Attestation of Attending Supervision of Obstetric Fellow: Evaluation and management procedures were performed by the Obstetric Fellow under my supervision and collaboration.  I have reviewed the Obstetric Fellow's note and chart, and I agree with the management and plan.  UGONNA  ANYANWU, MD, FACOG Attending Obstetrician & Gynecologist Faculty Practice, [facility]   

## 2013-09-15 NOTE — Discharge Instructions (Signed)
Cuidados en el postparto luego de un parto por cesárea  °(Postpartum Care After Cesarean Delivery) °Después del parto (período de postparto), la estadía normal en el hospital es de 24-72 horas. Si hubo problemas con el trabajo de parto o el parto, o si tiene otros problemas médicos, es posible que deba permanecer en el hospital por más tiempo.  °Mientras esté en el hospital, recibirá ayuda e instrucciones sobre cómo cuidar de usted misma y de su bebé recién nacido durante el postparto.  °Mientras esté en el hospital:  °· Es normal que sienta dolor o molestias en la incisión en el abdomen. Asegúrese de decirle a las enfermeras si siente dolor, así como donde siente el dolor y qué empeora el dolor. °· Si está amamantando, puede sentir contracciones dolorosas en el útero durante algunas semanas. Esto es normal. Las contracciones ayudan a que el útero vuelva a su tamaño normal. °· Es normal tener algo de sangrado después del parto. °¨ Durante los primeros 1-3 días después del parto, el flujo es de color rojo y la cantidad puede ser similar a un período. °¨ Es frecuente que el flujo se inicie y se detenga. °¨ En los primeros días, puede eliminar algunos coágulos pequeños. Informe a las enfermeras si comienza a eliminar coágulos grandes o aumenta el flujo. °¨ No  elimine los coágulos de sangre por el inodoro antes de que la enfermera los vea. °¨ Durante los próximos 3 a 10 días después del parto, el flujo debe ser más acuoso y rosado o marrón. °¨ De diez a catorce días después del parto, el flujo debe ser una pequeña cantidad de secreción de color blanco amarillento. °¨ La cantidad de flujo disminuirá en las primeras semanas después del parto. El flujo puede detenerse en 6-8 semanas. La mayoría de las mujeres no tienen más flujo a las 12 semanas después del parto. °· Usted debe cambiar sus apósitos con frecuencia. °· Lávese bien las manos con agua y jabón durante al menos 20 segundos después de cambiar el apósito, usar el  baño o antes de sostener o alimentar a su recién nacido. °· Se le quitará la vía intravenosa (IV) cuando ya esté bebiendo suficientes líquidos. °· El tubo de drenaje para la orina (catéter urinario) que se inserta antes del parto puede ser retirado luego de 6-8 horas después del parto o cuando las piernas vuelvan a tener sensibilidad. Usted puede sentir que tiene que vaciar la vejiga durante las primeras 6-8 horas después de que le quiten el catéter. °· Si se siente débil, mareada o se desmaya, llame a su enfermera antes de levantarse de la cama por primera vez y antes de tomar una ducha por primera vez. °· En los primeros días después del parto, podrá sentir las mamas sensibles y llenas. Esto se llama congestión. La sensibilidad en los senos por lo general desaparece dentro de las 48-72 horas después de que ocurre la congestión. También puede notar que la leche se escapa de sus senos. Si no está amamantando no estimule sus pechos. La estimulación de las mamas hace que sus senos produzcan más leche. °· Pasar tanto tiempo como sea posible con el bebé recién nacido es muy importante. Durante este tiempo, usted y su bebé deben sentirse cerca y conocerse uno al otro. Tener al bebé en su habitación (alojamiento conjunto) ayudará a fortalecer el vínculo con el bebé recién nacido. Esto le dará tiempo para conocerlo y atenderlo de manera cómoda. °· Las hormonas se modifican después del parto. A   veces, los cambios hormonales pueden causar tristeza o ganas de llorar por un tiempo. Estos sentimientos no deben durar más de unos pocos días. Si duran más que eso, debe hablar con su médico. °· Si lo desea, hable con su médico acerca de los métodos de planificación familiar o métodos anticonceptivos. °· Hable con su médico acerca de las vacunas. El médico puede indicarle que se aplique las siguientes vacunas antes de salir del hospital: °¨ Vacuna contra el tétanos, la difteria y la tos ferina (Tdap) o el tétanos y la difteria (Td).  Es muy importante que usted y su familia (incluyendo a los abuelos) u otras personas que cuidan al recién nacido estén al día con las vacunas Tdap o Td. Las vacunas Tdap o Td pueden ayudar a proteger al recién nacido de enfermedades. °¨ Inmunización contra la rubéola. °¨ Inmunización contra la varicela. °¨ Inmunización contra la gripe. Usted debe recibir esta vacunación anual si no la ha recibido durante el embarazo. °Document Released: 03/02/2012 °ExitCare® Patient Information ©2015 [employer]. This information is not intended to replace advice given to you by your health care provider. Make sure you discuss any questions you have with your health care provider. ° °

## 2013-09-15 NOTE — Lactation Note (Signed)
This note was copied from the chart of Girl Sable Martinez-Villafana. Lactation Consultation Note; Mom reports that baby just fed for 30 minutes with NS. Reports she is feeding better today. Mom states she does not have pump at home or plans to get one. Manual pump given with instructions for use and cleaning. Mom tried it and a few drops of Colostrum came to nipple after a few pumps. Mom pleased. Reports that breasts are feeling heavier today. Has not been pumping. Reviewed engorgement prevention and treatment,. No questions at present. To call prn  Patient Name: Girl Nalia Martinez-Villafana Today's Date: 09/15/2013 Reason for consult: Follow-up assessment   Maternal Data    Feeding    LATCH Score/Interventions                      Lactation Tools Discussed/Used WIC Program: Yes Pump Review: Setup, frequency, and cleaning Initiated by:: DW Date initiated:: 09/15/13   Consult Status Consult Status: Complete    Pamelia HoitWeeks, Salimatou Simone D 09/15/2013, 10:09 AM

## 2013-09-17 ENCOUNTER — Inpatient Hospital Stay (HOSPITAL_COMMUNITY): Admission: RE | Admit: 2013-09-17 | Payer: Medicaid Other | Source: Ambulatory Visit

## 2013-10-06 ENCOUNTER — Encounter (HOSPITAL_COMMUNITY): Payer: Self-pay | Admitting: Obstetrics and Gynecology

## 2014-01-29 ENCOUNTER — Encounter (HOSPITAL_COMMUNITY): Payer: Self-pay | Admitting: Obstetrics and Gynecology

## 2014-04-23 IMAGING — US US OB COMP +14 WK
1 series · 12 of 28 positions shown · non-contrast
Comparison: none

[Series 1: us ob comp +14 wk · 12 of 123 slices shown]
[im 5/123]
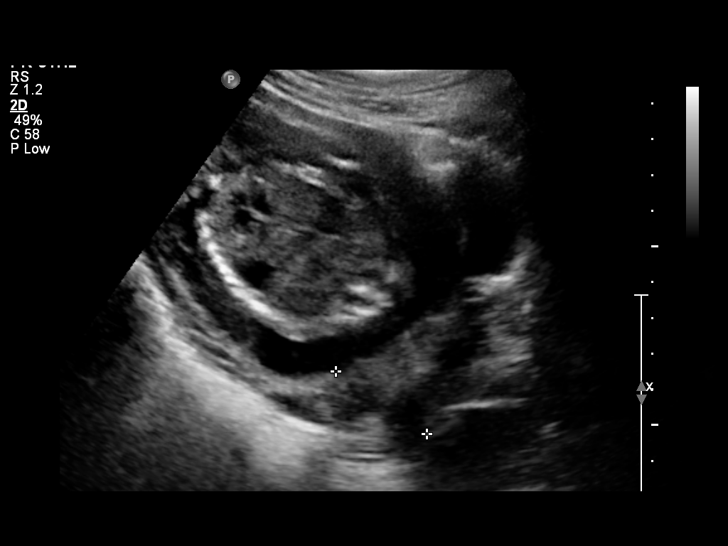
[im 14/123]
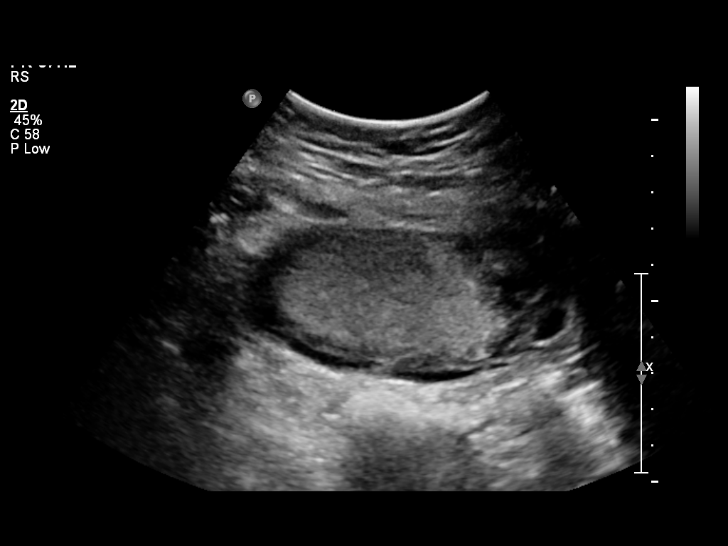
[im 23/123]
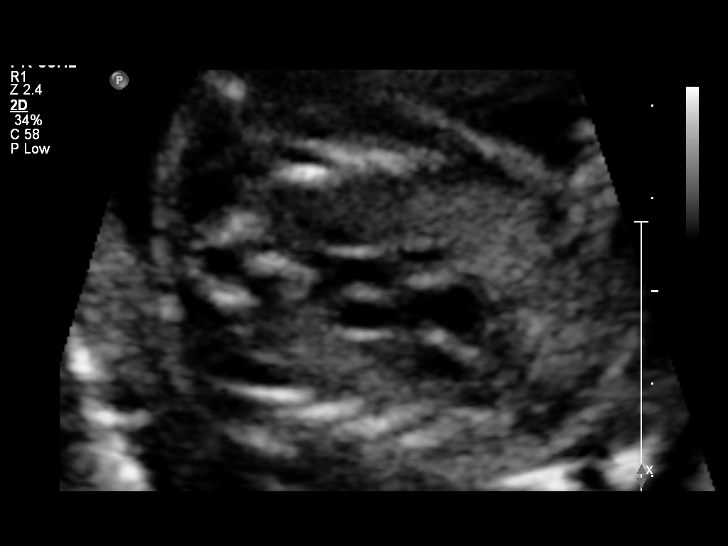
[im 37/123]
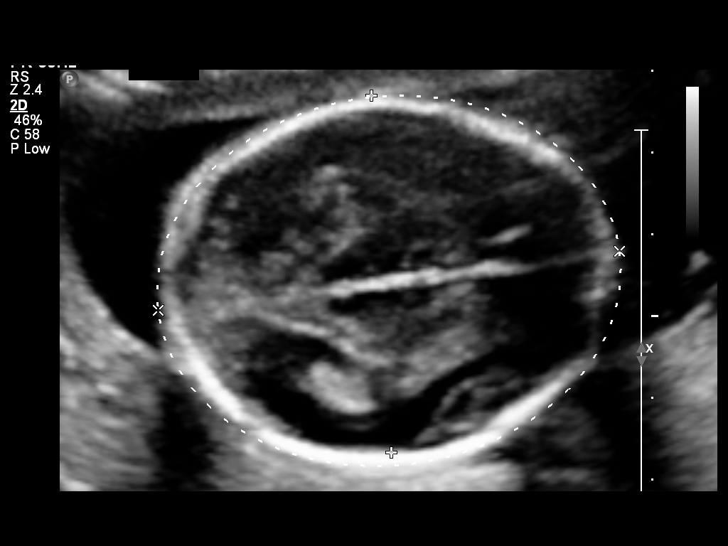
[im 46/123]
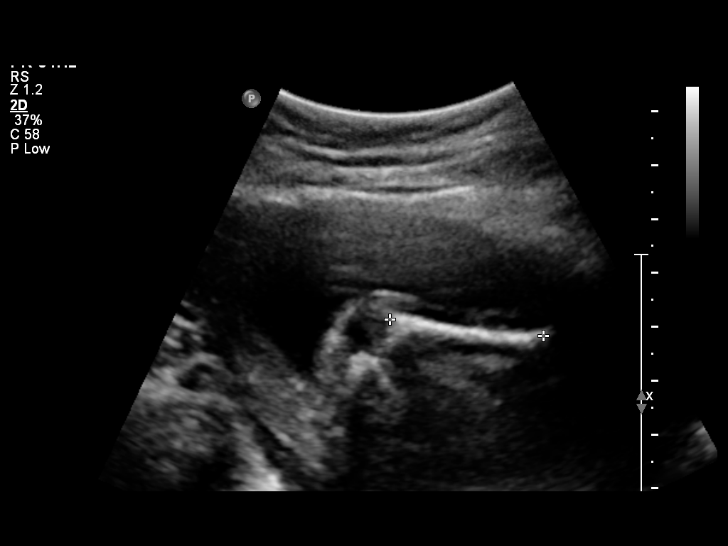
[im 55/123]
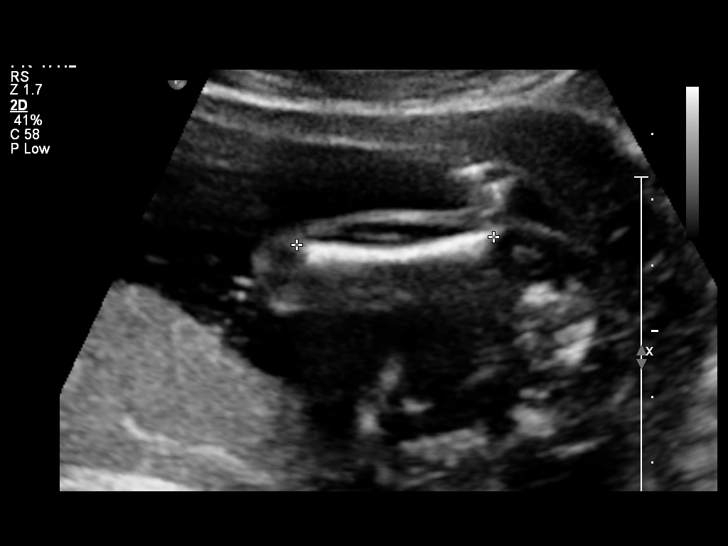
[im 68/123]
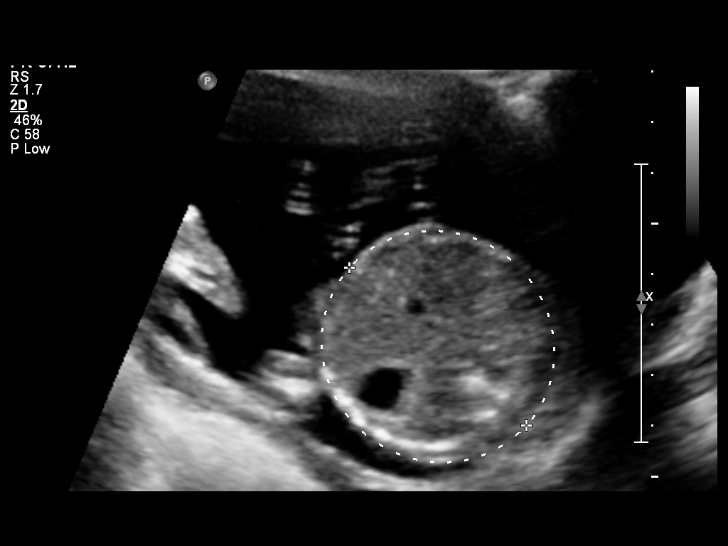
[im 77/123]
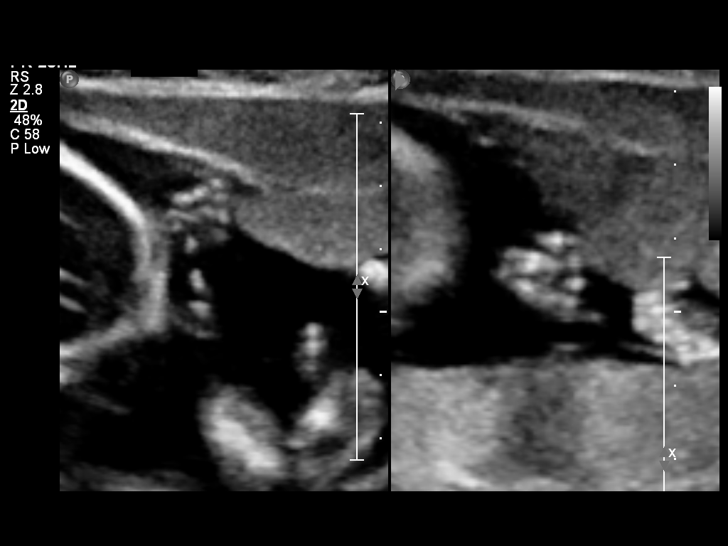
[im 86/123]
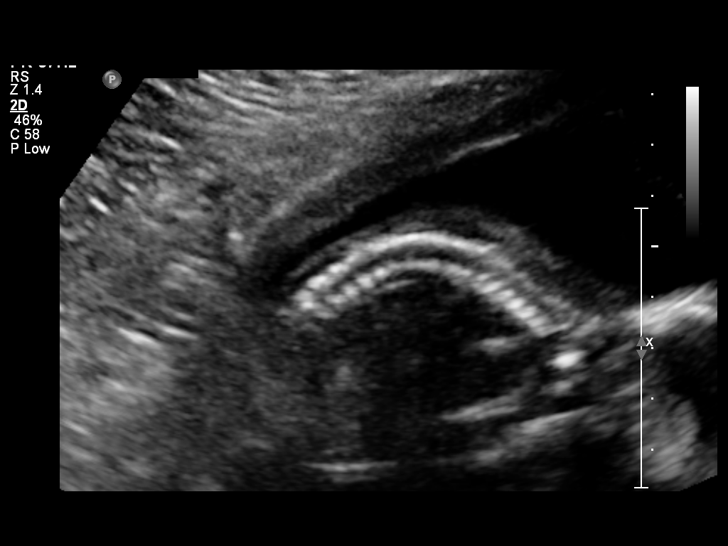
[im 100/123]
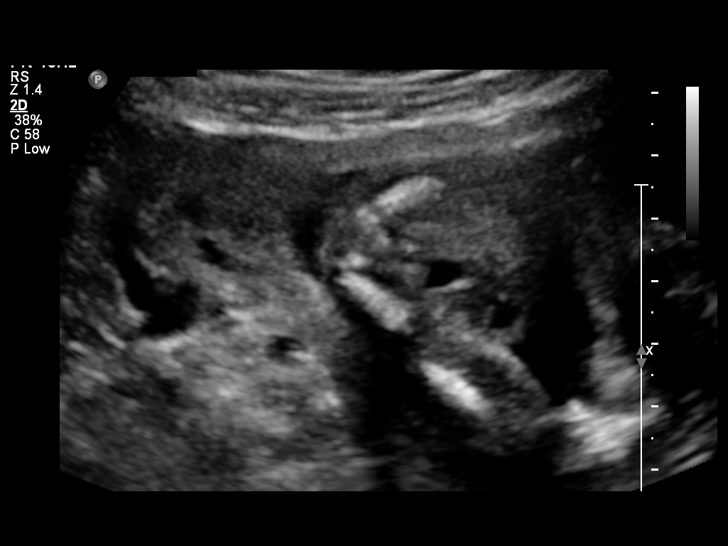
[im 109/123]
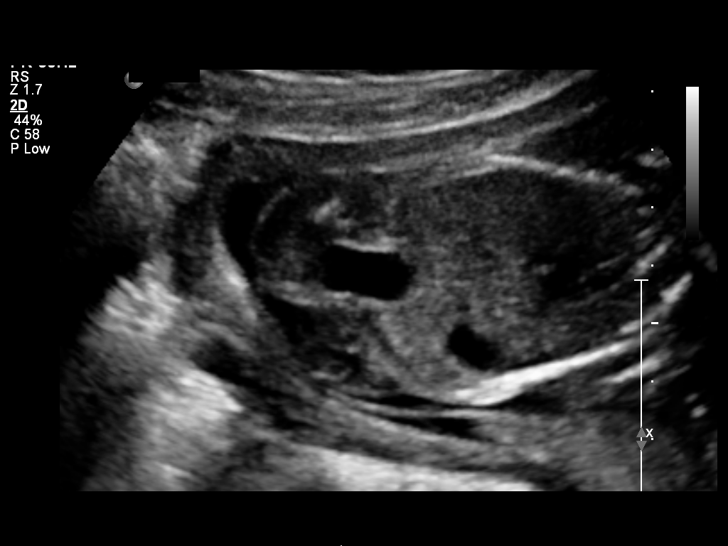
[im 118/123]
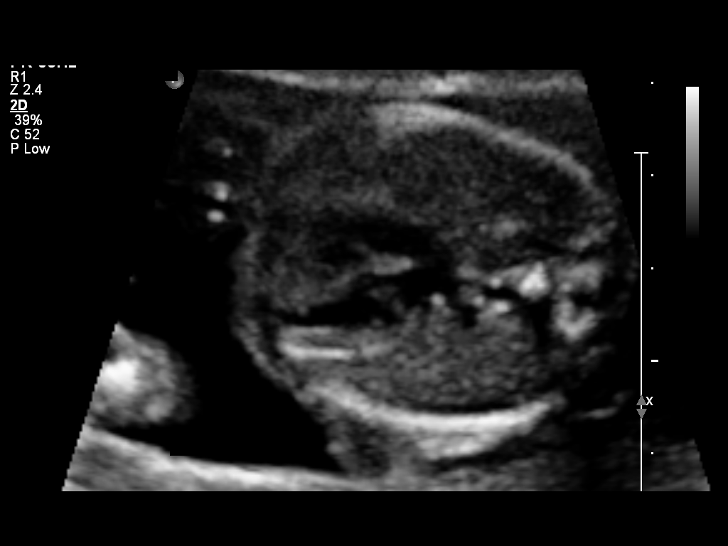

[12 of 28 positions shown; findings below may reference images not displayed]

OBSTETRICS REPORT
                      (Signed Final 04/14/2013 [DATE])

             ACUNA

Service(s) Provided

 US OB COMP + 14 WK                                    76805.1
Indications

 Basic anatomic survey
Fetal Evaluation

 Num Of Fetuses:    1
 Fetal Heart Rate:  153                          bpm
 Cardiac Activity:  Observed
 Presentation:      Variable
 Placenta:          Posterior, above cervical
                    os
 P. Cord            Visualized, central
 Insertion:

 Amniotic Fluid
 AFI FV:      Subjectively within normal limits
                                             Larg Pckt:     4.9  cm
Biometry

 BPD:     43.7  mm     G. Age:  19w 1d                CI:        74.42   70 - 86
                                                      FL/HC:      18.5   16.1 -

 HC:     160.8  mm     G. Age:  18w 6d       31  %    HC/AC:      1.13   1.09 -

 AC:     142.1  mm     G. Age:  19w 4d       60  %    FL/BPD:
 FL:      29.7  mm     G. Age:  19w 1d       44  %    FL/AC:      20.9   20 - 24
 HUM:     28.8  mm     G. Age:  19w 2d       56  %
 CER:     18.2  mm     G. Age:  18w 1d       19  %
 NFT:     2.77  mm

 Est. FW:     286  gm    0 lb 10 oz      49  %
Gestational Age

 LMP:           19w 1d        Date:  12/01/12                 EDD:   09/07/13
 U/S Today:     19w 1d                                        EDD:   09/07/13
 Best:          19w 1d     Det. By:  LMP  (12/01/12)          EDD:   09/07/13
Anatomy
 Cranium:          Appears normal         Aortic Arch:      Appears normal
 Fetal Cavum:      Appears normal         Ductal Arch:      Appears normal
 Ventricles:       Appears normal         Diaphragm:        Appears normal
 Choroid Plexus:   Appears normal         Stomach:          Appears normal, left
                                                            sided
 Cerebellum:       Appears normal         Abdomen:          Appears normal
 Posterior Fossa:  Appears normal         Abdominal Wall:   Appears nml (cord
                                                            insert, abd wall)
 Nuchal Fold:      Appears normal         Cord Vessels:     Appears normal (3
                                                            vessel cord)
 Face:             Orbits appear          Kidneys:          Appear normal
                   normal
 Lips:             Appears normal         Bladder:          Appears normal
 Heart:            Appears normal         Spine:            Appears normal
                   (4CH, axis, and
                   situs)
 RVOT:             Not well visualized    Lower             Appears normal
                                          Extremities:
 LVOT:             Appears normal         Upper             Appears normal
                                          Extremities:

 Other:  Heels and 5th digit visualized. Fetus appears to be a female.
         Technically difficult due to fetal position.
Targeted Anatomy

 Fetal Central Nervous System
 Cisterna Magna:
Cervix Uterus Adnexa

 Cervical Length:    3.08     cm

 Cervix:       Normal appearance by transabdominal scan.
 Uterus:       No abnormality visualized.
 Cul De Sac:   No free fluid seen.

 Left Ovary:    Size(cm) L: 3.29 x W: 2.33 x H: 1.63  Volume(cc):
 Right Ovary:   Size(cm) L: 3.09 x W: 2.29 x H: 2.21  Volume(cc):
Impression

 IUP at 19+1 weeks
 Normal detailed fetal anatomy; limited views of RVOT
 Markers of aneuploidy: none
 Normal amniotic fluid volume
 Measurements consistent with LMP dating
Recommendations

 Follow-up as clinically indicated, noting a follow up study to
 complete evaluation of RVOT may be scheduled if desired.

 TAKATU with us.  Please do not hesitate to

## 2015-10-31 LAB — CYSTIC FIBROSIS DIAGNOSTIC STUDY: Interpretation-CFDNA:: NEGATIVE

## 2016-01-08 ENCOUNTER — Encounter (HOSPITAL_COMMUNITY): Payer: Self-pay | Admitting: Emergency Medicine

## 2016-01-08 ENCOUNTER — Inpatient Hospital Stay (HOSPITAL_COMMUNITY)
Admission: AD | Admit: 2016-01-08 | Discharge: 2016-01-09 | Disposition: A | Discharge status: Home / Self Care | Payer: No Typology Code available for payment source | Source: Ambulatory Visit | Attending: Obstetrics and Gynecology | Admitting: Obstetrics and Gynecology

## 2016-01-08 DIAGNOSIS — O4700 False labor before 37 completed weeks of gestation, unspecified trimester: Secondary | ICD-10-CM

## 2016-01-08 DIAGNOSIS — O4702 False labor before 37 completed weeks of gestation, second trimester: Secondary | ICD-10-CM | POA: Diagnosis not present

## 2016-01-08 DIAGNOSIS — O26892 Other specified pregnancy related conditions, second trimester: Secondary | ICD-10-CM | POA: Diagnosis present

## 2016-01-08 DIAGNOSIS — Z3A25 25 weeks gestation of pregnancy: Secondary | ICD-10-CM | POA: Diagnosis not present

## 2016-01-08 DIAGNOSIS — O479 False labor, unspecified: Secondary | ICD-10-CM

## 2016-01-08 MED ORDER — LACTATED RINGERS IV BOLUS (SEPSIS)
1000.0000 mL | Freq: Once | INTRAVENOUS | Status: AC
  Administered 2016-01-08: 1000 mL via INTRAVENOUS
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
## 2016-01-08 NOTE — Progress Notes (Signed)
Dr Emelda FearFerguson notified. [facility]. Haywood LassoLynette, [facility] charge RN given report.
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
## 2016-01-08 NOTE — ED Notes (Signed)
Paged OB Rapid 

## 2016-01-09 ENCOUNTER — Inpatient Hospital Stay (HOSPITAL_COMMUNITY): Payer: No Typology Code available for payment source

## 2016-01-09 MED ORDER — LACTATED RINGERS IV BOLUS (SEPSIS)
1000.0000 mL | Freq: Once | INTRAVENOUS | Status: AC
  Administered 2016-01-09: 1000 mL via INTRAVENOUS

## 2016-01-09 MED ORDER — NIFEDIPINE 10 MG PO CAPS
10.0000 mg | ORAL_CAPSULE | ORAL | Status: DC | PRN
  Administered 2016-01-09: 10 mg via ORAL
  Filled 2016-01-09: qty 1

## 2016-01-09 NOTE — ED Notes (Signed)
[facility] in [facility] condition .
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
## 2016-01-09 NOTE — Discharge Instructions (Signed)

## 2016-04-06 ENCOUNTER — Inpatient Hospital Stay (HOSPITAL_COMMUNITY)
Admission: AD | Admit: 2016-04-06 | Discharge: 2016-04-07 | DRG: 776 | Disposition: A | Discharge status: Home / Self Care | Payer: Medicaid Other | Source: Ambulatory Visit | Attending: Family Medicine | Admitting: Family Medicine

## 2016-04-06 ENCOUNTER — Encounter (HOSPITAL_COMMUNITY): Payer: Self-pay

## 2016-04-06 DIAGNOSIS — Z3A38 38 weeks gestation of pregnancy: Secondary | ICD-10-CM

## 2016-04-06 LAB — CBC
HEMATOCRIT: 32.6 % — AB (ref 36.0–46.0)
Hemoglobin: 11.2 g/dL — ABNORMAL LOW (ref 12.0–15.0)
MCH: 29.7 pg (ref 26.0–34.0)
MCHC: 34.4 g/dL (ref 30.0–36.0)
MCV: 86.5 fL (ref 78.0–100.0)
Platelets: 268 10*3/uL (ref 150–400)
RBC: 3.77 MIL/uL — AB (ref 3.87–5.11)
RDW: 13.5 % (ref 11.5–15.5)
WBC: 16.3 10*3/uL — AB (ref 4.0–10.5)

## 2016-04-06 LAB — TYPE AND SCREEN
ABO/RH(D): O POS
Antibody Screen: NEGATIVE

## 2016-04-06 MED ORDER — BENZOCAINE-MENTHOL 20-0.5 % EX AERO
1.0000 "application " | INHALATION_SPRAY | CUTANEOUS | Status: DC | PRN
  Administered 2016-04-06: 1 via TOPICAL
  Filled 2016-04-06: qty 56

## 2016-04-06 MED ORDER — OXYTOCIN 10 UNIT/ML IJ SOLN
10.0000 [IU] | Freq: Once | INTRAMUSCULAR | Status: AC
  Administered 2016-04-06: 10 [IU] via INTRAMUSCULAR
  Filled 2016-04-06: qty 1

## 2016-04-06 MED ORDER — WITCH HAZEL-GLYCERIN EX PADS: 1.0000 "application " | MEDICATED_PAD | CUTANEOUS | Status: DC | PRN

## 2016-04-06 MED ORDER — ONDANSETRON HCL 4 MG PO TABS: 4.0000 mg | ORAL_TABLET | ORAL | Status: DC | PRN

## 2016-04-06 MED ORDER — ERYTHROMYCIN 5 MG/GM OP OINT
TOPICAL_OINTMENT | OPHTHALMIC | Status: AC
  Filled 2016-04-06: qty 1

## 2016-04-06 MED ORDER — ZOLPIDEM TARTRATE 5 MG PO TABS: 5.0000 mg | ORAL_TABLET | Freq: Every evening | ORAL | Status: DC | PRN

## 2016-04-06 MED ORDER — OXYTOCIN 10 UNIT/ML IJ SOLN
INTRAMUSCULAR | Status: AC
  Administered 2016-04-06: 10 [IU]
  Filled 2016-04-06: qty 1

## 2016-04-06 MED ORDER — ONDANSETRON HCL 4 MG/2ML IJ SOLN: 4.0000 mg | INTRAMUSCULAR | Status: DC | PRN

## 2016-04-06 MED ORDER — SENNOSIDES-DOCUSATE SODIUM 8.6-50 MG PO TABS
2.0000 | ORAL_TABLET | ORAL | Status: DC
  Administered 2016-04-06: 2 via ORAL
  Filled 2016-04-06: qty 2

## 2016-04-06 MED ORDER — SIMETHICONE 80 MG PO CHEW: 80.0000 mg | CHEWABLE_TABLET | ORAL | Status: DC | PRN

## 2016-04-06 MED ORDER — DIPHENHYDRAMINE HCL 25 MG PO CAPS: 25.0000 mg | ORAL_CAPSULE | Freq: Four times a day (QID) | ORAL | Status: DC | PRN

## 2016-04-06 MED ORDER — PRENATAL MULTIVITAMIN CH
1.0000 | ORAL_TABLET | Freq: Every day | ORAL | Status: DC
  Administered 2016-04-07: 1 via ORAL
  Filled 2016-04-06: qty 1

## 2016-04-06 MED ORDER — LIDOCAINE HCL (PF) 1 % IJ SOLN
INTRAMUSCULAR | Status: AC
  Filled 2016-04-06: qty 30

## 2016-04-06 MED ORDER — COCONUT OIL OIL: 1.0000 "application " | TOPICAL_OIL | Status: DC | PRN

## 2016-04-06 MED ORDER — IBUPROFEN 600 MG PO TABS
600.0000 mg | ORAL_TABLET | Freq: Four times a day (QID) | ORAL | Status: DC
  Administered 2016-04-06 – 2016-04-07 (×4): 600 mg via ORAL
  Filled 2016-04-06 (×4): qty 1

## 2016-04-06 MED ORDER — ACETAMINOPHEN 325 MG PO TABS: 650.0000 mg | ORAL_TABLET | ORAL | Status: DC | PRN

## 2016-04-06 MED ORDER — TETANUS-DIPHTH-ACELL PERTUSSIS 5-2.5-18.5 LF-MCG/0.5 IM SUSP: 0.5000 mL | Freq: Once | INTRAMUSCULAR | Status: DC

## 2016-04-06 MED ORDER — DIBUCAINE 1 % RE OINT: 1.0000 "application " | TOPICAL_OINTMENT | RECTAL | Status: DC | PRN

## 2016-04-06 NOTE — Lactation Note (Signed)
This note was copied from a baby's chart. Lactation Consultation Note  Patient Name: Colleen Ballard Today's Date: 04/06/2016 Reason for consult: Initial assessment Baby at 3 hr of life. Mom had baby at home. She tried bf in MAU but baby "only latched a few minutes". RN reports mom has "great colostrum when we were manually expressing". Mom did not bf her older child. RN was working with baby and then baby went to sleep. Left lactation phone number for mom to call for latch help at the next feeding. Discussed baby behavior, feeding frequency, baby belly size, voids, wt loss, breast changes, and nipple care. Given lactation handouts. Aware of OP services and support group.    Maternal Data Has patient been taught Hand Expression?: Yes Does the patient have breastfeeding experience prior to this delivery?: No  Feeding Feeding Type: Breast Fed  LATCH Score/Interventions                      Lactation Tools Discussed/Used WIC Program: Yes   Consult Status Consult Status: Follow-up Date: 04/07/16 Follow-up type: In-patient    [name] 04/06/2016, 6:20 PM

## 2016-04-06 NOTE — H&P (Signed)
Colleen Ballard is a 26 y.o. female presenting for precipitous delivery at home. Patient began prenatal care at 15 weeks [facility] the [facility]. Patient's history is significant for C-section in 2015 for failure to descend (surgery performed at [facility] by Dr. Christin Bach). She states that she had signed her VBAC consent and was planning to [facility].  See below for rest of pregnancy history and medical history.   At delivery patient was 38 weeks and 0 days by certain LMP.  I examined the patient postpartum in MAU at 1445. Patient's fundus was firm, -2, with mild to moderate lochia. I repaired 3 labial lacerations with a 4-0 vicryl using sterile technique.   Per patient verbal history:  Patient states that her water broke at home at 11am today. The water was clear. Her contractions started shortly thereafter. She was preparing to come to the MAU when her labor intensified and she delivered in her bathroom with a member of the fire department assisting at 1414. Infant was dried and stimulated and the cord cut and clamped. Infant's APGARs were 8/9. Patient and her infant were then transported to MAU.   EMS verbal report:  EMS states that when they arrived at the patient's home the patient was crowning and a member of the fire department proceeded to deliver the infant. EMS delivered the placenta with gentle traction.  VSS for transport, bleeding was mild. Infants blood sugar was 88 and EMS tried to latch the baby while en route.    OB History    Gravida Para Term Preterm AB Living   2 1 1     1    SAB TAB Ectopic Multiple Live Births           1     Past Medical History:  Diagnosis Date  . Medical history non-contributory    Past Surgical History:  Procedure Laterality Date  . CESAREAN SECTION N/A 09/13/2013   Procedure: CESAREAN SECTION;  Surgeon: Tilda Burrow, MD;  Location: [facility];  Service: Obstetrics;  Laterality: N/A;  . NO PAST SURGERIES     Family History: family history  includes Diabetes in her maternal grandmother; Hyperlipidemia in her father. Social History:  reports that she has never smoked. She does not have any smokeless tobacco history on file. She reports that she does not drink alcohol or use drugs.     Maternal Diabetes: No Genetic Screening: Normal Maternal Ultrasounds/Referrals: Normal Fetal Ultrasounds or other Referrals:  None Maternal Substance Abuse:  No Significant Maternal Medications:  None Significant Maternal Lab Results:  None Other Comments:  None  ROS Maternal Medical History:  Reason for admission: Precipitous delivery at home  Contractions: Onset was 1-2 hours ago.   Frequency: regular.    Prenatal complications: No bleeding, cholelithiasis, HIV, PIH, infection, IUGR, nephrolithiasis, oligohydramnios, placental abnormality, polyhydramnios, pre-eclampsia, preterm labor, substance abuse, thrombocytopenia or thrombophilia.   Prenatal Complications - Diabetes: none.      Blood pressure 114/68, pulse 83, temperature 98.1 F (36.7 C), temperature source Oral, resp. rate 20, last menstrual period 07/15/2015, unknown if currently breastfeeding. Exam Physical Exam  Constitutional: She is oriented to person, place, and time. She appears well-developed and well-nourished.  HENT:  Head: Normocephalic.  Neck: Normal range of motion.  Cardiovascular: Normal rate.   Respiratory: Effort normal.  GI: Soft. She exhibits no distension and no mass. There is no tenderness. There is no rebound and no guarding.  Genitourinary: Vagina normal.  Neurological: She is alert and oriented to  person, place, and time.  Skin: Skin is warm and dry.  Psychiatric: She has a normal mood and affect.    Prenatal labs:.res ABO, Rh: --/--/O POS (01/08 1730) Antibody: PENDING (01/08 1730) Rubella:  Immune RPR:   Non reactive HBsAg:   non-reactive HIV:   negative GBS:   negative  Assessment/Plan: 1. Admit to Mother Colleen Ballard; regular postpartum care.   2. Infant to Mother Colleen Ballard; regular new born care.     Colleen Ballard CNM 04/06/2016, 6:31 PM

## 2016-04-06 NOTE — Progress Notes (Signed)
Upon walking into the room to give report mom was standing at the bedside. While RN's were giving report the patient voiced that she was dizzy and felt a little nauseated. Pt told to sit down and was laid back. Fundus checked by both RN's with light bleeding. Pt said she no longer felt dizzy or nauseated. Encouraged patient to rest and asked her to call for assistance the next time she stands. Will continue to monitor. mec

## 2016-04-07 ENCOUNTER — Encounter (HOSPITAL_COMMUNITY): Payer: Self-pay | Admitting: *Deleted

## 2016-04-07 DIAGNOSIS — Z3A38 38 weeks gestation of pregnancy: Secondary | ICD-10-CM

## 2016-04-07 LAB — RPR: RPR Ser Ql: NONREACTIVE

## 2016-04-07 NOTE — Progress Notes (Signed)
Post Partum Day #1, precipitous delivery at home VBAC.  Subjective: no complaints, up ad lib, voiding and tolerating PO  Objective: Blood pressure (!) 96/57, pulse 81, temperature 98.1 F (36.7 C), temperature source Oral, resp. rate 18, last menstrual period 07/15/2015, unknown if currently breastfeeding.  Physical Exam:  General: alert, cooperative and no distress Lochia: appropriate Uterine Fundus: firm Incision: no significant drainage, no dehiscence, no significant erythema DVT Evaluation: No evidence of DVT seen on physical exam. No cords or calf tenderness. No significant calf/ankle edema.   Recent Labs  04/06/16 1730  HGB 11.2*  HCT 32.6*    Assessment/Plan: Plan for discharge tomorrow and Contraception undecided, counseling given.    LOS: 1 day   Roe CoombsRachelle A Denney, CNM 04/07/2016, 8:01 AM

## 2016-04-07 NOTE — Progress Notes (Signed)
UR chart review completed.  

## 2016-04-07 NOTE — Discharge Instructions (Signed)

## 2016-04-07 NOTE — Discharge Summary (Signed)
    OB Discharge Summary     Patient Name: Colleen Ballard DOB: 1991-02-13 MRN: [id number]  Date of admission: 04/06/2016 Delivering MD:     Date of discharge: 04/07/2016  Admitting diagnosis:  delivered at home Intrauterine pregnancy: 8138w1d     Secondary diagnosis:  Active Problems:   Vaginal delivery  Additional problems: none     Discharge diagnosis: VBAC delivery at home                                                                                             Post partum procedures:none  Augmentation: none  Complications: None  Hospital course:  Patient presented to MAU after unplanned home birth. In MAU patient noted to have 3 labial lacerations, which were repaired. Admitted for postpartum care. Uncomplicated hospital course.   Physical exam  Vitals:   04/06/16 1609 04/06/16 1716 04/06/16 1823 04/07/16 0600  BP: 116/65 110/66 114/68 (!) 96/57  Pulse: 88 89 83 81  Resp: 16 20 20 18   Temp:  98.2 F (36.8 C) 98.1 F (36.7 C) 98.1 F (36.7 C)  TempSrc:  Oral Oral Oral   General: alert and cooperative Lochia: appropriate Uterine Fundus: firm Incision: none DVT Evaluation: No evidence of DVT seen on physical exam. Labs: Lab Results  Component Value Date   WBC 16.3 (H) 04/06/2016   HGB 11.2 (L) 04/06/2016   HCT 32.6 (L) 04/06/2016   MCV 86.5 04/06/2016   PLT 268 04/06/2016   No flowsheet data found.  Discharge instruction: per After Visit Summary and "Baby and Me Booklet".  After visit meds:  Allergies as of 04/07/2016   No Known Allergies     Medication List    TAKE these medications   prenatal multivitamin Tabs tablet Take 1 tablet by mouth at bedtime.       Diet: routine diet  Activity: Advance as tolerated. Pelvic rest for 6 weeks.   Outpatient follow up:6 weeks Follow up Appt:No future appointments. Follow up Visit:No Follow-up on file.  Postpartum contraception: Undecided  Newborn Data: Live born female  Birth Weight: 5  lb 8.9 oz (2520 g) APGAR: 8, 9  Baby Feeding: Breast Disposition:home with mother   04/07/2016 Renne Muscaaniel L Dorrine Montone, MD

## 2020-03-30 NOTE — L&D Delivery Note (Deleted)
OB/GYN Faculty Practice Delivery Note  Colleen Ballard is a 30 y.o. [id number] s/p SVD at [redacted]w[redacted]d. She was admitted for SOL.   ROM: 2h 27m with clear fluid GBS Status: unknown Maximum Maternal Temperature: 98.4  Delivery Date/Time: 1518 Delivery: Called to room and patient was complete and pushing. Head delivered ROA. No nuchal cord present. Shoulder and body delivered in usual fashion. Infant with spontaneous cry, placed on mother's abdomen, dried and stimulated. Cord clamped x 2 after 1-minute delay, and cut by FOB under my direct supervision. Cord blood drawn. Placenta delivered spontaneously with gentle cord traction. Fundus firm with massage and Pitocin. Labia, perineum, vagina, and cervix were inspected, with no lacerations.   Placenta: membranes intact, 3VC Complications: none Lacerations: none EBL: 300 Analgesia: none  Infant: baby girl  APGARs 9,9  2780g  [name], MD PGY-2 Family Medicine Resident    Attestation:  I confirm that I have verified the information documented in the resident's note and that I have also personally reperformed the physical exam and all medical decision making activities.   I was gloved and present for entire delivery SVD without incident No difficulty with shoulders No lacerations  Aviva Signs, CNM

## 2020-03-30 NOTE — L&D Delivery Note (Addendum)
OB/GYN Faculty Practice Delivery Note  Colleen Ballard is a 30 y.o. [id number] s/p SVD at [redacted]w[redacted]d. She was admitted for SOL.   ROM: 2h 67m with clear fluid GBS Status: unknown Maximum Maternal Temperature: 98.4  Delivery Date/Time: 9518 Delivery: Called to room and patient was complete and pushing. Head delivered ROA. No nuchal cord present. Shoulder and body delivered in usual fashion. Infant with spontaneous cry, placed on mother's abdomen, dried and stimulated. Cord clamped x 2 after 1-minute delay, and cut by FOB under my direct supervision. Cord blood drawn. Placenta delivered spontaneously with gentle cord traction. Fundus firm with massage and Pitocin. Labia, perineum, vagina, and cervix were inspected, with no lacerations.   Placenta: membranes intact, 3VC Complications: none Lacerations: none EBL: 300 Analgesia: none  Infant: baby girl  APGARs 9,9  2780g  [name], MD PGY-2 Family Medicine Resident    Attestation:  I confirm that I have verified the information documented in the resident's note and that I have also personally reperformed the physical exam and all medical decision making activities.   I was gloved and present for entire delivery SVD without incident No difficulty with shoulders No lacerations  IM Pitocin given after placenta delivered No time for IV due to imminent delivery  Aviva Signs, CNM

## 2020-10-03 LAB — HEPATITIS C ANTIBODY: HCV Ab: NEGATIVE

## 2020-10-03 LAB — AMB REFERRAL TO OB-GYN
Drug Screen, Urine: NEGATIVE
Pap: NEGATIVE
Urine Culture, OB: NEGATIVE

## 2020-10-03 LAB — OB RESULTS CONSOLE HIV ANTIBODY (ROUTINE TESTING): HIV: NONREACTIVE

## 2020-10-03 LAB — OB RESULTS CONSOLE RUBELLA ANTIBODY, IGM: Rubella: IMMUNE

## 2020-10-03 LAB — OB RESULTS CONSOLE GC/CHLAMYDIA
Chlamydia: NEGATIVE
Gonorrhea: NEGATIVE

## 2020-10-03 LAB — OB RESULTS CONSOLE VARICELLA ZOSTER ANTIBODY, IGG
Varicella: IMMUNE
Varicella: IMMUNE

## 2020-10-03 LAB — OB RESULTS CONSOLE HGB/HCT, BLOOD
HCT: 37 (ref 29–41)
Hemoglobin: 12.3

## 2020-10-03 LAB — OB RESULTS CONSOLE ANTIBODY SCREEN: Antibody Screen: NEGATIVE

## 2020-10-03 LAB — OB RESULTS CONSOLE RPR: RPR: NONREACTIVE

## 2020-10-15 DIAGNOSIS — Z87898 Personal history of other specified conditions: Secondary | ICD-10-CM | POA: Insufficient documentation

## 2020-10-21 ENCOUNTER — Ambulatory Visit (INDEPENDENT_AMBULATORY_CARE_PROVIDER_SITE_OTHER): Payer: Medicaid Other | Admitting: Obstetrics & Gynecology

## 2020-10-21 ENCOUNTER — Other Ambulatory Visit: Payer: Self-pay

## 2020-10-21 ENCOUNTER — Encounter: Payer: Self-pay | Admitting: Obstetrics & Gynecology

## 2020-10-21 VITALS — BP 103/67 | HR 81 | Wt 143.0 lb

## 2020-10-21 DIAGNOSIS — Z87898 Personal history of other specified conditions: Secondary | ICD-10-CM

## 2020-10-21 DIAGNOSIS — Z136 Encounter for screening for cardiovascular disorders: Secondary | ICD-10-CM

## 2020-10-21 DIAGNOSIS — O0990 Supervision of high risk pregnancy, unspecified, unspecified trimester: Secondary | ICD-10-CM | POA: Insufficient documentation

## 2020-10-21 MED ORDER — BLOOD PRESSURE MONITORING DEVI: 1.0000 | 0 refills | Status: DC

## 2020-10-21 NOTE — Progress Notes (Signed)
  Subjective:    Colleen Ballard is a [id number] [redacted]w[redacted]d being seen today for her first obstetrical visit.  Her obstetrical history is significant for  low birth weight . Patient does intend to breast feed. Pregnancy history fully reviewed.  Patient reports no complaints.  Vitals:   10/21/20 1617  BP: 103/67  Pulse: 81  Weight: 143 lb (64.9 kg)    HISTORY: OB History  Gravida Para Term Preterm AB Living  3 2 2  0   2  SAB IAB Ectopic Multiple Live Births          2    # Outcome Date GA Lbr Len/2nd Weight Sex Delivery Anes PTL Lv  3 Current           2 Term 04/06/16 [redacted]w[redacted]d  5 lb 8 oz (2.495 kg) F VBAC  N LIV  1 Term 09/13/13 [redacted]w[redacted]d 19:30 / 04:36 6 lb 15.8 oz (3.17 kg) F CS-LTranv EPI, Spinal N LIV   Past Medical History:  Diagnosis Date   Medical history non-contributory    Past Surgical History:  Procedure Laterality Date   CESAREAN SECTION N/A 09/13/2013   Procedure: CESAREAN SECTION;  Surgeon: 09/15/2013, MD;  Location: [facility];  Service: Obstetrics;  Laterality: N/A;   NO PAST SURGERIES     Family History  Problem Relation Age of Onset   Hyperlipidemia Father    Diabetes Maternal Grandmother      Exam    Uterus:     Pelvic Exam: Deferred, examined at the [facility]                                   Skin: normal coloration and turgor, no rashes    Neurologic: oriented, normal mood   Extremities: normal strength, tone, and muscle mass   HEENT PERRLA       Neck supple   Cardiovascular: regular rate and rhythm   Respiratory:  appears well, vitals normal, no respiratory distress, acyanotic, normal RR   Abdomen: soft, non-tender; bowel sounds normal; no masses,  no organomegaly     Assessment:    Pregnancy: [facility] Patient Active Problem List   Diagnosis Date Noted   Supervision of high risk pregnancy, antepartum 10/21/2020   History of low birth weight 10/15/2020   S/P C-section 09/13/2013        Plan:     Initial labs drawn. Prenatal  vitamins. Problem list reviewed and updated. Genetic Screening discussed ordered.  Ultrasound discussed; fetal survey: ordered.  Follow up in 4 weeks. 50% of 30 min visit spent on counseling and coordination of care.     09/15/2013 10/21/2020

## 2020-11-05 ENCOUNTER — Ambulatory Visit: Payer: Medicaid Other | Attending: Obstetrics & Gynecology

## 2020-11-05 ENCOUNTER — Ambulatory Visit: Payer: Medicaid Other

## 2020-11-05 ENCOUNTER — Ambulatory Visit: Payer: Medicaid Other | Admitting: *Deleted

## 2020-11-05 ENCOUNTER — Encounter: Payer: Self-pay | Admitting: *Deleted

## 2020-11-05 ENCOUNTER — Other Ambulatory Visit: Payer: Self-pay | Admitting: Obstetrics & Gynecology

## 2020-11-05 ENCOUNTER — Other Ambulatory Visit: Payer: Self-pay

## 2020-11-05 ENCOUNTER — Other Ambulatory Visit: Payer: Self-pay | Admitting: *Deleted

## 2020-11-05 VITALS — BP 106/62 | HR 88

## 2020-11-05 DIAGNOSIS — Z87898 Personal history of other specified conditions: Secondary | ICD-10-CM | POA: Diagnosis present

## 2020-11-05 DIAGNOSIS — Z3A19 19 weeks gestation of pregnancy: Secondary | ICD-10-CM | POA: Diagnosis present

## 2020-11-05 DIAGNOSIS — Z3689 Encounter for other specified antenatal screening: Secondary | ICD-10-CM | POA: Insufficient documentation

## 2020-11-05 DIAGNOSIS — Z3482 Encounter for supervision of other normal pregnancy, second trimester: Secondary | ICD-10-CM | POA: Insufficient documentation

## 2020-11-05 DIAGNOSIS — Z362 Encounter for other antenatal screening follow-up: Secondary | ICD-10-CM

## 2020-11-05 DIAGNOSIS — O0990 Supervision of high risk pregnancy, unspecified, unspecified trimester: Secondary | ICD-10-CM | POA: Insufficient documentation

## 2020-11-09 LAB — MATERNIT21 PLUS CORE+SCA
Fetal Fraction: 9
Monosomy X (Turner Syndrome): NOT DETECTED
Result (T21): NEGATIVE
Trisomy 13 (Patau syndrome): NEGATIVE
Trisomy 18 (Edwards syndrome): NEGATIVE
Trisomy 21 (Down syndrome): NEGATIVE
XXX (Triple X Syndrome): NOT DETECTED
XXY (Klinefelter Syndrome): NOT DETECTED
XYY (Jacobs Syndrome): NOT DETECTED

## 2020-11-13 ENCOUNTER — Telehealth: Payer: Self-pay | Admitting: Obstetrics and Gynecology

## 2020-11-13 NOTE — Telephone Encounter (Signed)
The patient was informed of the results of her recent MaterniT21 testing which yielded NEGATIVE results.  The patient's specimen showed DNA consistent with two copies of chromosomes 21, 18 and 13.  The sensitivity for trisomy 21, trisomy 18 and trisomy 13 using this testing are reported as 99.1%, 99.9% and 91.7% respectively.  Thus, while the results of this testing are highly accurate, they are not considered diagnostic at this time.  Should more definitive information be desired, the patient may still consider amniocentesis.   As requested to know by the patient, sex chromosome analysis was included for this sample.  Results are consistent with a female fetus. This is predicted with >99% accuracy. This testing also screens for sex chromosome conditions with greater than 96% accuracy and was negative for those conditions.   A maternal serum AFP only should be considered if screening for neural tube defects is desired.  We may be reached at [phone] with any questions or concerns.  Yukie Bergeron F. Vandell Kun, MS, CGC  

## 2020-11-13 NOTE — Telephone Encounter (Signed)
Left message for patient to call for lab results. 

## 2020-11-18 ENCOUNTER — Ambulatory Visit (INDEPENDENT_AMBULATORY_CARE_PROVIDER_SITE_OTHER): Payer: Medicaid Other | Admitting: Nurse Practitioner

## 2020-11-18 ENCOUNTER — Other Ambulatory Visit: Payer: Self-pay

## 2020-11-18 ENCOUNTER — Encounter: Payer: Self-pay | Admitting: Nurse Practitioner

## 2020-11-18 ENCOUNTER — Encounter: Payer: Medicaid Other | Admitting: Nurse Practitioner

## 2020-11-18 VITALS — BP 103/63 | HR 76 | Wt 146.9 lb

## 2020-11-18 DIAGNOSIS — Z87898 Personal history of other specified conditions: Secondary | ICD-10-CM

## 2020-11-18 DIAGNOSIS — Z3A21 21 weeks gestation of pregnancy: Secondary | ICD-10-CM

## 2020-11-18 DIAGNOSIS — O0990 Supervision of high risk pregnancy, unspecified, unspecified trimester: Secondary | ICD-10-CM

## 2020-11-18 DIAGNOSIS — Z98891 History of uterine scar from previous surgery: Secondary | ICD-10-CM

## 2020-11-18 DIAGNOSIS — R87616 Satisfactory cervical smear but lacking transformation zone: Secondary | ICD-10-CM | POA: Insufficient documentation

## 2020-11-18 NOTE — Progress Notes (Signed)
    Subjective:  Colleen Ballard is a 30 y.o. G3P2002 at [redacted]w[redacted]d being seen today for ongoing prenatal care.  She is currently monitored for the following issues for this high-risk pregnancy and has History of C-section; History of low birth weight; and Supervision of high risk pregnancy, antepartum on their problem list.  Patient reports no complaints.  Contractions: Not present. Vag. Bleeding: None.  Movement: Present. Denies leaking of fluid.   The following portions of the patient's history were reviewed and updated as appropriate: allergies, current medications, past family history, past medical history, past social history, past surgical history and problem list. Problem list updated.  Objective:   Vitals:   11/18/20 0956  BP: 103/63  Pulse: 76  Weight: 146 lb 14.4 oz (66.6 kg)    Fetal Status: Fetal Heart Rate (bpm): 149 Fundal Height: 20 cm Movement: Present     General:  Alert, oriented and cooperative. Patient is in no acute distress.  Skin: Skin is warm and dry. No rash noted.   Cardiovascular: Normal heart rate noted  Respiratory: Normal respiratory effort, no problems with respiration noted  Abdomen: Soft, gravid, appropriate for gestational age. Pain/Pressure: Absent     Pelvic:  Cervical exam deferred        Extremities: Normal range of motion.  Edema: None  Mental Status: Normal mood and affect. Normal behavior. Normal judgment and thought content.   Urinalysis:      Assessment and Plan:  Pregnancy: G3P2002 at [redacted]w[redacted]d  1. Supervision of high risk pregnancy, antepartum Declines AFP Doing well and starting to feel the baby move. Will work to get HIV and rubella result for her chart - [facility] not in her records.  2. History of low birth weight Has another [place] scheduled on 12-31-20  3. History of C-section Will need to see MD for delivery plan - wants VBAC Last pregnancy delivered unexpectedly at home  Preterm labor symptoms  and general obstetric precautions including but not limited to vaginal bleeding, contractions, leaking of fluid and fetal movement were reviewed in detail with the patient. Please refer to After Visit Summary for other counseling recommendations.  Return in about 3 weeks (around 12/09/2020) for in person ROB.  Nolene Bernheim, RN, MSN, NP-BC Nurse Practitioner, [facility] for [employer], [facility] 11/18/2020 10:17 AM

## 2020-12-09 ENCOUNTER — Encounter: Payer: Self-pay | Admitting: Obstetrics and Gynecology

## 2020-12-09 ENCOUNTER — Ambulatory Visit (INDEPENDENT_AMBULATORY_CARE_PROVIDER_SITE_OTHER): Payer: Medicaid Other | Admitting: Obstetrics and Gynecology

## 2020-12-09 ENCOUNTER — Other Ambulatory Visit: Payer: Self-pay

## 2020-12-09 VITALS — BP 103/68 | HR 80 | Wt 150.4 lb

## 2020-12-09 DIAGNOSIS — O0990 Supervision of high risk pregnancy, unspecified, unspecified trimester: Secondary | ICD-10-CM

## 2020-12-09 DIAGNOSIS — Z87898 Personal history of other specified conditions: Secondary | ICD-10-CM

## 2020-12-09 DIAGNOSIS — Z98891 History of uterine scar from previous surgery: Secondary | ICD-10-CM

## 2020-12-09 NOTE — Progress Notes (Signed)
   PRENATAL VISIT NOTE  Subjective:  Colleen Ballard is a 30 y.o. G3P2002 at [redacted]w[redacted]d being seen today for ongoing prenatal care.  She is currently monitored for the following issues for this low-risk pregnancy and has History of VBAC; History of low birth weight; and Supervision of high risk pregnancy, antepartum on their problem list.  Patient reports no complaints.  Contractions: Not present. Vag. Bleeding: None.  Movement: Present. Denies leaking of fluid.   The following portions of the patient's history were reviewed and updated as appropriate: allergies, current medications, past family history, past medical history, past social history, past surgical history and problem list.   Objective:   Vitals:   12/09/20 1109  BP: 103/68  Pulse: 80  Weight: 150 lb 6.4 oz (68.2 kg)    Fetal Status: Fetal Heart Rate (bpm): 143 Fundal Height: 24 cm Movement: Present     General:  Alert, oriented and cooperative. Patient is in no acute distress.  Skin: Skin is warm and dry. No rash noted.   Cardiovascular: Normal heart rate noted  Respiratory: Normal respiratory effort, no problems with respiration noted  Abdomen: Soft, gravid, appropriate for gestational age.  Pain/Pressure: Absent     Pelvic: Cervical exam deferred        Extremities: Normal range of motion.  Edema: None  Mental Status: Normal mood and affect. Normal behavior. Normal judgment and thought content.   Assessment and Plan:  Pregnancy: G3P2002 at [redacted]w[redacted]d 1. Supervision of high risk pregnancy, antepartum 28wk labs nv  2. History of VBAC Tolac consent nv or at 30wks  3. History of low birth weight 10/4 f/u growth  8/9 normal efw  Preterm labor symptoms and general obstetric precautions including but not limited to vaginal bleeding, contractions, leaking of fluid and fetal movement were reviewed in detail with the patient. Please refer to After Visit Summary for other counseling recommendations.   Return in  about 3 weeks (around 12/30/2020) for low risk ob, in person, fasting 2hr GTT, md visit.  Future Appointments  Date Time Provider Department Center  12/30/2020  8:15 AM Milas Hock, MD [facility]  12/30/2020  8:50 AM [facility] LAB [facility]  12/31/2020  3:30 PM [facility] NURSE [facility]  12/31/2020  3:45 PM [facility] US5 [facility]    [place] Bing, MD

## 2020-12-09 NOTE — Progress Notes (Signed)
Called Ms. Shaw Medical Records at the [facility], no answer left VM to get copy of Pt's Hep C & HIV labs faxed.

## 2020-12-27 NOTE — Progress Notes (Signed)
   PRENATAL VISIT NOTE  Subjective:  Colleen Ballard is a 30 y.o. G3P2002 at [redacted]w[redacted]d being seen today for ongoing prenatal care.  She is currently monitored for the following issues for this low-risk pregnancy and has History of VBAC; History of low birth weight; and Supervision of high risk pregnancy, antepartum on their problem list.  Patient reports no complaints.  Contractions: Not present. Vag. Bleeding: None.  Movement: Present. Denies leaking of fluid.   The following portions of the patient's history were reviewed and updated as appropriate: allergies, current medications, past family history, past medical history, past social history, past surgical history and problem list.   Objective:   Vitals:   12/30/20 0826  BP: (!) 94/57  Pulse: 76  Weight: 154 lb (69.9 kg)    Fetal Status: Fetal Heart Rate (bpm): 141 Fundal Height: 27 cm Movement: Present     General:  Alert, oriented and cooperative. Patient is in no acute distress.  Skin: Skin is warm and dry. No rash noted.   Cardiovascular: Normal heart rate noted  Respiratory: Normal respiratory effort, no problems with respiration noted  Abdomen: Soft, gravid, appropriate for gestational age.  Pain/Pressure: Absent     Pelvic: Cervical exam deferred        Extremities: Normal range of motion.  Edema: None  Mental Status: Normal mood and affect. Normal behavior. Normal judgment and thought content.   Assessment and Plan:  Pregnancy: G3P2002 at [redacted]w[redacted]d 1. History of VBAC - We discussed her history of c-section. Her previous c-section was due to  arrest of descent after pushing for 3 hr.  She has a history of  successful vaginal delivery - We discussed the risks associated with repeat c-section: bleeding, infection, injury to surrounding organs/tissues I.e. bowel/bladder, development of scar tissue, wound complications such as wound separation or infection, need for additional surgery, percreta/acreta - We discussed the  risks associated with TOLAC: risk of it being unsuccessful, specially in the context of her history, the risks in general of a vaginal delivery (prolapse, SUI, differences in recovery, pelvic floor dysfunction, etc), and the risk of uterine rupture. We discussed with the risk of uterine rupture that while rare it is not easily predicted, that it is a surgical emergency, and it can be potentially catastrophic for mom and baby. We discussed if uterine rupture that it may necessitate hysterectomy if the rupture caused issues with bleeding that could not be managed with other surgical options.  - After counseling, the patient was given the opportunity to ask questions and all questions answered.  - After considering her options, she would like to [facility]. Consent reviewed and signed.  - Information provided to the patient  2. History of low birth weight 8/9 normal growth 10/4 for f/u growth  3. Supervision of high risk pregnancy, antepartum Offered tdap - given Offered flu shot - she thinks she will accept next visit. Discuss next time.  28w labs today  Preterm labor symptoms and general obstetric precautions including but not limited to vaginal bleeding, contractions, leaking of fluid and fetal movement were reviewed in detail with the patient. Please refer to After Visit Summary for other counseling recommendations.   Return in about 2 weeks (around 01/13/2021).  Future Appointments  Date Time Provider Department Center  12/30/2020  8:50 AM [facility]  12/31/2020  3:30 PM [facility] NURSE [facility]  12/31/2020  3:45 PM [facility] US5 [facility]    Milas Hock, MD

## 2020-12-30 ENCOUNTER — Other Ambulatory Visit: Payer: Medicaid Other

## 2020-12-30 ENCOUNTER — Other Ambulatory Visit: Payer: Self-pay

## 2020-12-30 ENCOUNTER — Encounter: Payer: Self-pay | Admitting: Obstetrics and Gynecology

## 2020-12-30 ENCOUNTER — Ambulatory Visit (INDEPENDENT_AMBULATORY_CARE_PROVIDER_SITE_OTHER): Payer: Medicaid Other | Admitting: Obstetrics and Gynecology

## 2020-12-30 VITALS — BP 94/57 | HR 76 | Wt 154.0 lb

## 2020-12-30 DIAGNOSIS — Z23 Encounter for immunization: Secondary | ICD-10-CM

## 2020-12-30 DIAGNOSIS — Z98891 History of uterine scar from previous surgery: Secondary | ICD-10-CM

## 2020-12-30 DIAGNOSIS — Z3A27 27 weeks gestation of pregnancy: Secondary | ICD-10-CM

## 2020-12-30 DIAGNOSIS — O0990 Supervision of high risk pregnancy, unspecified, unspecified trimester: Secondary | ICD-10-CM

## 2020-12-30 DIAGNOSIS — Z87898 Personal history of other specified conditions: Secondary | ICD-10-CM

## 2020-12-31 ENCOUNTER — Encounter: Payer: Self-pay | Admitting: *Deleted

## 2020-12-31 ENCOUNTER — Ambulatory Visit: Payer: Medicaid Other | Attending: Obstetrics

## 2020-12-31 ENCOUNTER — Ambulatory Visit: Payer: Medicaid Other | Admitting: *Deleted

## 2020-12-31 VITALS — BP 111/57 | HR 88

## 2020-12-31 DIAGNOSIS — Z3A27 27 weeks gestation of pregnancy: Secondary | ICD-10-CM | POA: Diagnosis not present

## 2020-12-31 DIAGNOSIS — Z362 Encounter for other antenatal screening follow-up: Secondary | ICD-10-CM | POA: Insufficient documentation

## 2020-12-31 DIAGNOSIS — O0990 Supervision of high risk pregnancy, unspecified, unspecified trimester: Secondary | ICD-10-CM

## 2020-12-31 DIAGNOSIS — O09292 Supervision of pregnancy with other poor reproductive or obstetric history, second trimester: Secondary | ICD-10-CM

## 2020-12-31 DIAGNOSIS — O34219 Maternal care for unspecified type scar from previous cesarean delivery: Secondary | ICD-10-CM

## 2020-12-31 LAB — HIV ANTIBODY (ROUTINE TESTING W REFLEX): HIV Screen 4th Generation wRfx: NONREACTIVE

## 2020-12-31 LAB — GLUCOSE TOLERANCE, 2 HOURS W/ 1HR
Glucose, 1 hour: 155 mg/dL (ref 65–179)
Glucose, 2 hour: 114 mg/dL (ref 65–152)
Glucose, Fasting: 75 mg/dL (ref 65–91)

## 2020-12-31 LAB — CBC
Hematocrit: 33 % — ABNORMAL LOW (ref 34.0–46.6)
Hemoglobin: 11.1 g/dL (ref 11.1–15.9)
MCH: 30.4 pg (ref 26.6–33.0)
MCHC: 33.6 g/dL (ref 31.5–35.7)
MCV: 90 fL (ref 79–97)
Platelets: 238 10*3/uL (ref 150–450)
RBC: 3.65 x10E6/uL — ABNORMAL LOW (ref 3.77–5.28)
RDW: 12.7 % (ref 11.7–15.4)
WBC: 8.9 10*3/uL (ref 3.4–10.8)

## 2020-12-31 LAB — RPR: RPR Ser Ql: NONREACTIVE

## 2021-01-01 ENCOUNTER — Other Ambulatory Visit: Payer: Self-pay | Admitting: Maternal & Fetal Medicine

## 2021-01-01 DIAGNOSIS — Z8759 Personal history of other complications of pregnancy, childbirth and the puerperium: Secondary | ICD-10-CM

## 2021-01-01 DIAGNOSIS — Z98891 History of uterine scar from previous surgery: Secondary | ICD-10-CM

## 2021-01-13 ENCOUNTER — Ambulatory Visit (INDEPENDENT_AMBULATORY_CARE_PROVIDER_SITE_OTHER): Payer: Medicaid Other | Admitting: Student

## 2021-01-13 ENCOUNTER — Telehealth: Payer: Self-pay | Admitting: Student

## 2021-01-13 ENCOUNTER — Other Ambulatory Visit: Payer: Self-pay

## 2021-01-13 VITALS — BP 106/64 | HR 86 | Wt 158.6 lb

## 2021-01-13 DIAGNOSIS — O0990 Supervision of high risk pregnancy, unspecified, unspecified trimester: Secondary | ICD-10-CM

## 2021-01-13 DIAGNOSIS — Z3A29 29 weeks gestation of pregnancy: Secondary | ICD-10-CM

## 2021-01-13 NOTE — Progress Notes (Signed)
Patient ID: Colleen Ballard, female   DOB: 1991-01-05, 30 y.o.   MRN: [id number]   PRENATAL VISIT NOTE  Subjective:  Colleen Ballard is a 30 y.o. G3P2002 at [redacted]w[redacted]d being seen today for ongoing prenatal care.  She is currently monitored for the following issues for this low-risk pregnancy and has History of VBAC; History of low birth weight; and Supervision of high risk pregnancy, antepartum on their problem list.  Patient reports no complaints.  Contractions: Not present.  .  Movement: Present. Denies leaking of fluid.   The following portions of the patient's history were reviewed and updated as appropriate: allergies, current medications, past family history, past medical history, past social history, past surgical history and problem list.   Objective:   Vitals:   01/13/21 1458  BP: 106/64  Pulse: 86  Weight: 158 lb 9.6 oz (71.9 kg)    Fetal Status: Fetal Heart Rate (bpm): 143 Fundal Height: 31 cm Movement: Present     General:  Alert, oriented and cooperative. Patient is in no acute distress.  Skin: Skin is warm and dry. No rash noted.   Cardiovascular: Normal heart rate noted  Respiratory: Normal respiratory effort, no problems with respiration noted  Abdomen: Soft, gravid, appropriate for gestational age.  Pain/Pressure: Absent     Pelvic: Cervical exam deferred        Extremities: Normal range of motion.  Edema: None  Mental Status: Normal mood and affect. Normal behavior. Normal judgment and thought content.   Assessment and Plan:  Pregnancy: G3P2002 at [redacted]w[redacted]d 1. Supervision of high risk pregnancy, antepartum -still planning to VBAC -no concerns today -keep growth scan appt  -thinking about OCPs; does not want any more babies; will continue to discuss  Preterm labor symptoms and general obstetric precautions including but not limited to vaginal bleeding, contractions, leaking of fluid and fetal movement were reviewed in detail with the  patient. Please refer to After Visit Summary for other counseling recommendations.   Return in about 3 weeks (around 02/03/2021), or LROB with KK.  Future Appointments  Date Time Provider Department Center  02/11/2021  3:00 PM [facility] NURSE [facility]  02/11/2021  3:30 PM [facility] US3 [facility]    Marylene Land, CNM

## 2021-01-13 NOTE — Progress Notes (Signed)
Patient stated that she is feeling fine and has no questions or concerns  

## 2021-01-13 NOTE — Telephone Encounter (Signed)
Patient called clinic and spoke with Julienne Kass, who connected patient with me. Patient states that 20 minutes after she got home from her visit she had a small spot of bright red bleeding that was the size of a quarter on her underwear. This was after a BM.  She denies contractions, abdominal pain, abnormal discharge, decreased fetal movements. She denies any other complaints; per OB appt this afternoon, she had no complaints. She has not had recent sexual intercourse.    Reviewed [place] report from 10/4; patient does not have known previa.  Discussed that small spot of blood without any pain, contractions, or continuous bleeding is normal in pregnancy. Reviewed that patient should come to MAU for evaluation if she has any more bleeding, any abdominal pain, contractions, decreased fetal movements.  Patient is reassured by advice; she does not have any other questions and she will go to MAU if her condition worsens or changes.    Luna Kitchens

## 2021-02-10 ENCOUNTER — Ambulatory Visit (INDEPENDENT_AMBULATORY_CARE_PROVIDER_SITE_OTHER): Payer: Medicaid Other | Admitting: Student

## 2021-02-10 ENCOUNTER — Other Ambulatory Visit: Payer: Self-pay

## 2021-02-10 VITALS — BP 111/63 | HR 87 | Wt 158.0 lb

## 2021-02-10 DIAGNOSIS — Z3480 Encounter for supervision of other normal pregnancy, unspecified trimester: Secondary | ICD-10-CM

## 2021-02-10 DIAGNOSIS — Z3A33 33 weeks gestation of pregnancy: Secondary | ICD-10-CM

## 2021-02-10 NOTE — Progress Notes (Signed)
   PRENATAL VISIT NOTE  Subjective:  Colleen Ballard is a 30 y.o. G3P2002 at [redacted]w[redacted]d being seen today for ongoing prenatal care.  She is currently monitored for the following issues for this low-risk pregnancy and has History of VBAC; History of low birth weight; and Supervision of high risk pregnancy, antepartum on their problem list.  Patient reports no complaints. She still desires VBAC.  Contractions: Not present. Vag. Bleeding: None.  Movement: Present. Denies leaking of fluid.   The following portions of the patient's history were reviewed and updated as appropriate: allergies, current medications, past family history, past medical history, past social history, past surgical history and problem list.   Objective:   Vitals:   02/10/21 1046  BP: 111/63  Pulse: 87  Weight: 158 lb (71.7 kg)    Fetal Status: Fetal Heart Rate (bpm): 138 Fundal Height: 33 cm Movement: Present     General:  Alert, oriented and cooperative. Patient is in no acute distress.  Skin: Skin is warm and dry. No rash noted.   Cardiovascular: Normal heart rate noted  Respiratory: Normal respiratory effort, no problems with respiration noted  Abdomen: Soft, gravid, appropriate for gestational age.  Pain/Pressure: Absent     Pelvic: Cervical exam deferred        Extremities: Normal range of motion.  Edema: None  Mental Status: Normal mood and affect. Normal behavior. Normal judgment and thought content.   Assessment and Plan:  Pregnancy: G3P2002 at [redacted]w[redacted]d  1. [redacted] weeks gestation of pregnancy   -reviewed warning signs of labor -anticipatory guidance given for next visit in 2.5 weeks (no appts available next week) with GBS and GC CT -all questions answered  Preterm labor symptoms and general obstetric precautions including but not limited to vaginal bleeding, contractions, leaking of fluid and fetal movement were reviewed in detail with the patient. Please refer to After Visit Summary for other  counseling recommendations.   Return in about 3 weeks (around 03/03/2021), or LROB with APP.  Future Appointments  Date Time Provider Department Center  02/11/2021  3:00 PM [facility] NURSE [facility]  02/11/2021  3:30 PM [facility] US3 [facility]    Marylene Land, CNM

## 2021-02-11 ENCOUNTER — Ambulatory Visit: Payer: Medicaid Other | Attending: Maternal & Fetal Medicine

## 2021-02-11 ENCOUNTER — Ambulatory Visit: Payer: Medicaid Other | Admitting: *Deleted

## 2021-02-11 ENCOUNTER — Encounter: Payer: Self-pay | Admitting: *Deleted

## 2021-02-11 VITALS — BP 111/54 | HR 76

## 2021-02-11 DIAGNOSIS — Z3A33 33 weeks gestation of pregnancy: Secondary | ICD-10-CM

## 2021-02-11 DIAGNOSIS — O34219 Maternal care for unspecified type scar from previous cesarean delivery: Secondary | ICD-10-CM

## 2021-02-11 DIAGNOSIS — Z8759 Personal history of other complications of pregnancy, childbirth and the puerperium: Secondary | ICD-10-CM | POA: Insufficient documentation

## 2021-02-11 DIAGNOSIS — O0990 Supervision of high risk pregnancy, unspecified, unspecified trimester: Secondary | ICD-10-CM

## 2021-02-11 DIAGNOSIS — O09293 Supervision of pregnancy with other poor reproductive or obstetric history, third trimester: Secondary | ICD-10-CM

## 2021-02-11 DIAGNOSIS — Z98891 History of uterine scar from previous surgery: Secondary | ICD-10-CM | POA: Diagnosis present

## 2021-03-03 ENCOUNTER — Ambulatory Visit (INDEPENDENT_AMBULATORY_CARE_PROVIDER_SITE_OTHER): Payer: Medicaid Other | Admitting: Obstetrics & Gynecology

## 2021-03-03 ENCOUNTER — Other Ambulatory Visit (HOSPITAL_COMMUNITY)
Admission: RE | Admit: 2021-03-03 | Discharge: 2021-03-03 | Disposition: A | Discharge status: Home / Self Care | Payer: Medicaid Other | Source: Ambulatory Visit | Attending: Obstetrics & Gynecology | Admitting: Obstetrics & Gynecology

## 2021-03-03 ENCOUNTER — Other Ambulatory Visit: Payer: Self-pay

## 2021-03-03 VITALS — BP 114/71 | HR 95 | Wt 161.2 lb

## 2021-03-03 DIAGNOSIS — O0990 Supervision of high risk pregnancy, unspecified, unspecified trimester: Secondary | ICD-10-CM | POA: Diagnosis present

## 2021-03-03 DIAGNOSIS — Z87898 Personal history of other specified conditions: Secondary | ICD-10-CM

## 2021-03-03 DIAGNOSIS — Z98891 History of uterine scar from previous surgery: Secondary | ICD-10-CM

## 2021-03-03 NOTE — Progress Notes (Signed)
   PRENATAL VISIT NOTE  Subjective:  Colleen Ballard is a 30 y.o. [id number] at [redacted]w[redacted]d being seen today for ongoing prenatal care.  She is currently monitored for the following issues for this low-risk pregnancy and has History of VBAC; History of low birth weight; and Supervision of high risk pregnancy, antepartum on their problem list.  Patient reports no complaints.  Contractions: Not present. Vag. Bleeding: None.  Movement: Present. Denies leaking of fluid.   The following portions of the patient's history were reviewed and updated as appropriate: allergies, current medications, past family history, past medical history, past social history, past surgical history and problem list.   Objective:   Vitals:   03/03/21 1516  BP: 114/71  Pulse: 95  Weight: 161 lb 3.2 oz (73.1 kg)    Fetal Status: Fetal Heart Rate (bpm): 162 Fundal Height: 35 cm Movement: Present  Presentation: Vertex  General:  Alert, oriented and cooperative. Patient is in no acute distress.  Skin: Skin is warm and dry. No rash noted.   Cardiovascular: Normal heart rate noted  Respiratory: Normal respiratory effort, no problems with respiration noted  Abdomen: Soft, gravid, appropriate for gestational age.  Pain/Pressure: Absent     Pelvic: Cervical exam performed in the presence of a chaperone Dilation: 3 Effacement (%): 40 Station: -3  Extremities: Normal range of motion.  Edema: None  Mental Status: Normal mood and affect. Normal behavior. Normal judgment and thought content.   Assessment and Plan:  Pregnancy: G3P2002 at [redacted]w[redacted]d 1. Supervision of high risk pregnancy, antepartum Routine 36 weeks - Culture, beta strep (group b only) - GC/Chlamydia probe amp ([place])not at [facility]  2. History of low birth weight Had normal growth last [place]  3. History of VBAC Plans TOLAC  Preterm labor symptoms and general obstetric precautions including but not limited to vaginal bleeding, contractions, leaking of  fluid and fetal movement were reviewed in detail with the patient. Please refer to After Visit Summary for other counseling recommendations.   Return in about 1 week (around 03/10/2021).  No future appointments.  Scheryl Darter, MD

## 2021-03-05 ENCOUNTER — Inpatient Hospital Stay (HOSPITAL_COMMUNITY)
Admission: AD | Admit: 2021-03-05 | Discharge: 2021-03-06 | DRG: 807 | Disposition: A | Discharge status: Home / Self Care | Payer: Medicaid Other | Attending: Obstetrics and Gynecology | Admitting: Obstetrics and Gynecology

## 2021-03-05 ENCOUNTER — Other Ambulatory Visit: Payer: Self-pay

## 2021-03-05 ENCOUNTER — Encounter (HOSPITAL_COMMUNITY): Payer: Self-pay | Admitting: Obstetrics and Gynecology

## 2021-03-05 DIAGNOSIS — O4292 Full-term premature rupture of membranes, unspecified as to length of time between rupture and onset of labor: Secondary | ICD-10-CM | POA: Diagnosis not present

## 2021-03-05 DIAGNOSIS — O34219 Maternal care for unspecified type scar from previous cesarean delivery: Secondary | ICD-10-CM | POA: Diagnosis present

## 2021-03-05 DIAGNOSIS — Z3A36 36 weeks gestation of pregnancy: Secondary | ICD-10-CM

## 2021-03-05 DIAGNOSIS — Z20822 Contact with and (suspected) exposure to covid-19: Secondary | ICD-10-CM | POA: Diagnosis present

## 2021-03-05 DIAGNOSIS — O34211 Maternal care for low transverse scar from previous cesarean delivery: Secondary | ICD-10-CM | POA: Diagnosis not present

## 2021-03-05 DIAGNOSIS — O26893 Other specified pregnancy related conditions, third trimester: Secondary | ICD-10-CM | POA: Diagnosis present

## 2021-03-05 DIAGNOSIS — O0990 Supervision of high risk pregnancy, unspecified, unspecified trimester: Secondary | ICD-10-CM

## 2021-03-05 LAB — GC/CHLAMYDIA PROBE AMP (~~LOC~~) NOT AT ARMC
Chlamydia: NEGATIVE
Comment: NEGATIVE
Comment: NORMAL
Neisseria Gonorrhea: NEGATIVE

## 2021-03-05 LAB — RESP PANEL BY RT-PCR (FLU A&B, COVID) ARPGX2
Influenza A by PCR: NEGATIVE
Influenza B by PCR: NEGATIVE
SARS Coronavirus 2 by RT PCR: NEGATIVE

## 2021-03-05 LAB — CBC
HCT: 33.1 % — ABNORMAL LOW (ref 36.0–46.0)
Hemoglobin: 11 g/dL — ABNORMAL LOW (ref 12.0–15.0)
MCH: 30.2 pg (ref 26.0–34.0)
MCHC: 33.2 g/dL (ref 30.0–36.0)
MCV: 90.9 fL (ref 80.0–100.0)
Platelets: 240 10*3/uL (ref 150–400)
RBC: 3.64 MIL/uL — ABNORMAL LOW (ref 3.87–5.11)
RDW: 13.7 % (ref 11.5–15.5)
WBC: 12.6 10*3/uL — ABNORMAL HIGH (ref 4.0–10.5)
nRBC: 0 % (ref 0.0–0.2)

## 2021-03-05 LAB — TYPE AND SCREEN
ABO/RH(D): O POS
Antibody Screen: NEGATIVE

## 2021-03-05 LAB — RPR: RPR Ser Ql: NONREACTIVE

## 2021-03-05 MED ORDER — OXYCODONE-ACETAMINOPHEN 5-325 MG PO TABS: 1.0000 | ORAL_TABLET | ORAL | Status: DC | PRN

## 2021-03-05 MED ORDER — FLEET ENEMA 7-19 GM/118ML RE ENEM: 1.0000 | ENEMA | RECTAL | Status: DC | PRN

## 2021-03-05 MED ORDER — SENNOSIDES-DOCUSATE SODIUM 8.6-50 MG PO TABS
2.0000 | ORAL_TABLET | ORAL | Status: DC
  Administered 2021-03-05 – 2021-03-06 (×2): 2 via ORAL
  Filled 2021-03-05 (×2): qty 2

## 2021-03-05 MED ORDER — OXYTOCIN 10 UNIT/ML IJ SOLN
INTRAMUSCULAR | Status: AC
  Administered 2021-03-05: 10 [IU]
  Filled 2021-03-05: qty 1

## 2021-03-05 MED ORDER — OXYCODONE-ACETAMINOPHEN 5-325 MG PO TABS: 2.0000 | ORAL_TABLET | ORAL | Status: DC | PRN

## 2021-03-05 MED ORDER — SOD CITRATE-CITRIC ACID 500-334 MG/5ML PO SOLN: 30.0000 mL | ORAL | Status: DC | PRN

## 2021-03-05 MED ORDER — LACTATED RINGERS IV SOLN: 500.0000 mL | INTRAVENOUS | Status: DC | PRN

## 2021-03-05 MED ORDER — WITCH HAZEL-GLYCERIN EX PADS: 1.0000 "application " | MEDICATED_PAD | CUTANEOUS | Status: DC | PRN

## 2021-03-05 MED ORDER — DIBUCAINE (PERIANAL) 1 % EX OINT: 1.0000 "application " | TOPICAL_OINTMENT | CUTANEOUS | Status: DC | PRN

## 2021-03-05 MED ORDER — MEASLES, MUMPS & RUBELLA VAC IJ SOLR: 0.5000 mL | Freq: Once | INTRAMUSCULAR | Status: DC

## 2021-03-05 MED ORDER — DIPHENHYDRAMINE HCL 25 MG PO CAPS: 25.0000 mg | ORAL_CAPSULE | Freq: Four times a day (QID) | ORAL | Status: DC | PRN

## 2021-03-05 MED ORDER — TETANUS-DIPHTH-ACELL PERTUSSIS 5-2.5-18.5 LF-MCG/0.5 IM SUSY: 0.5000 mL | PREFILLED_SYRINGE | Freq: Once | INTRAMUSCULAR | Status: DC

## 2021-03-05 MED ORDER — BENZOCAINE-MENTHOL 20-0.5 % EX AERO: 1.0000 "application " | INHALATION_SPRAY | CUTANEOUS | Status: DC | PRN

## 2021-03-05 MED ORDER — ACETAMINOPHEN 325 MG PO TABS: 650.0000 mg | ORAL_TABLET | ORAL | Status: DC | PRN

## 2021-03-05 MED ORDER — OXYTOCIN-SODIUM CHLORIDE 30-0.9 UT/500ML-% IV SOLN: 2.5000 [IU]/h | INTRAVENOUS | Status: DC

## 2021-03-05 MED ORDER — LIDOCAINE HCL (PF) 1 % IJ SOLN: 30.0000 mL | INTRAMUSCULAR | Status: DC | PRN

## 2021-03-05 MED ORDER — PRENATAL MULTIVITAMIN CH
1.0000 | ORAL_TABLET | Freq: Every day | ORAL | Status: DC
  Administered 2021-03-05 – 2021-03-06 (×2): 1 via ORAL
  Filled 2021-03-05 (×2): qty 1

## 2021-03-05 MED ORDER — COCONUT OIL OIL: 1.0000 "application " | TOPICAL_OIL | Status: DC | PRN

## 2021-03-05 MED ORDER — OXYTOCIN BOLUS FROM INFUSION: 333.0000 mL | Freq: Once | INTRAVENOUS | Status: DC

## 2021-03-05 MED ORDER — ONDANSETRON HCL 4 MG/2ML IJ SOLN: 4.0000 mg | INTRAMUSCULAR | Status: DC | PRN

## 2021-03-05 MED ORDER — SIMETHICONE 80 MG PO CHEW
80.0000 mg | CHEWABLE_TABLET | ORAL | Status: DC | PRN
  Filled 2021-03-05: qty 1

## 2021-03-05 MED ORDER — ONDANSETRON HCL 4 MG/2ML IJ SOLN: 4.0000 mg | Freq: Four times a day (QID) | INTRAMUSCULAR | Status: DC | PRN

## 2021-03-05 MED ORDER — LACTATED RINGERS IV SOLN: INTRAVENOUS | Status: DC

## 2021-03-05 MED ORDER — IBUPROFEN 600 MG PO TABS
600.0000 mg | ORAL_TABLET | Freq: Four times a day (QID) | ORAL | Status: DC
  Administered 2021-03-05 – 2021-03-06 (×6): 600 mg via ORAL
  Filled 2021-03-05 (×6): qty 1

## 2021-03-05 MED ORDER — MISOPROSTOL 200 MCG PO TABS
ORAL_TABLET | ORAL | Status: AC
  Administered 2021-03-05: 600 ug
  Filled 2021-03-05: qty 3

## 2021-03-05 MED ORDER — ONDANSETRON HCL 4 MG PO TABS: 4.0000 mg | ORAL_TABLET | ORAL | Status: DC | PRN

## 2021-03-05 NOTE — MAU Note (Signed)
Ctxs since 2300. Some bloody show. Was 3cm on Monday. Good FM. Denies LOF

## 2021-03-05 NOTE — H&P (Addendum)
OBSTETRIC ADMISSION HISTORY AND PHYSICAL  Colleen Ballard is a 30 y.o. female G30P2002 with IUP at [redacted]w[redacted]d by LMP presenting for SROM. She reports +FMs, No LOF, no VB, no blurry vision, headaches or peripheral edema, and RUQ pain.  She plans on breast feeding. She request OCP for birth control. She received her prenatal care at [facility]   Dating: By LMP --->  Estimated Date of Delivery: 03/29/21  Sono:    @[redacted]w[redacted]d , CWD, normal anatomy, 2000g, 19% EFW   Prenatal History/Complications:  Prior CS and VBAC Prior FGR  Past Medical History: Past Medical History:  Diagnosis Date   Medical history non-contributory     Past Surgical History: Past Surgical History:  Procedure Laterality Date   CESAREAN SECTION N/A 09/13/2013   Procedure: CESAREAN SECTION;  Surgeon: 09/15/2013, MD;  Location: [facility];  Service: Obstetrics;  Laterality: N/A;    Obstetrical History: OB History     Gravida  3   Para  2   Term  2   Preterm  0   AB      Living  2      SAB      IAB      Ectopic      Multiple      Live Births  2           Social History Social History   Socioeconomic History   Marital status: Married    Spouse name: Not on file   Number of children: Not on file   Years of education: Not on file   Highest education level: Not on file  Occupational History   Not on file  Tobacco Use   Smoking status: Never   Smokeless tobacco: Never  Vaping Use   Vaping Use: Never used  Substance and Sexual Activity   Alcohol use: No   Drug use: No   Sexual activity: Yes    Birth control/protection: None  Other Topics Concern   Not on file  Social History Narrative   Not on file   Social Determinants of Health   Financial Resource Strain: Not on file  Food Insecurity: No Food Insecurity   Worried About Running Out of Food in the Last Year: Never true   Ran Out of Food in the Last Year: Never true  Transportation Needs: No Transportation Needs   Lack of  Transportation (Medical): No   Lack of Transportation (Non-Medical): No  Physical Activity: Not on file  Stress: Not on file  Social Connections: Not on file    Family History: Family History  Problem Relation Age of Onset   Hyperlipidemia Father    Diabetes Maternal Grandmother     Allergies: No Active Allergies  Medications Prior to Admission  Medication Sig Dispense Refill Last Dose   Blood Pressure Monitoring DEVI 1 each by Does not apply route once a week. (Patient not taking: Reported on 11/18/2020) 1 each 0    Prenatal Vit-Fe Fumarate-FA (PRENATAL MULTIVITAMIN) TABS tablet Take 1 tablet by mouth at bedtime.         Review of Systems   All systems reviewed and negative except as stated in HPI  Blood pressure 126/64, pulse 81, temperature 98.2 F (36.8 C), resp. rate 17, height 5' (1.524 m), weight 73 kg, last menstrual period 06/22/2020, SpO2 99 %, unknown if currently breastfeeding. General appearance: alert and cooperative Lungs: clear to auscultation bilaterally Heart: regular rate and rhythm Abdomen: soft, non-tender; bowel sounds normal Extremities: Homans sign  is negative, no sign of DVT Presentation: cephalic Fetal monitoring: Appropriate BHR. Moderate variability. +Accels Uterine activity: Every 1-3 minutes Dilation: 10 Station: Plus 1 Exam by:: Camelia Eng, CNM   Prenatal labs: ABO, Rh:   Antibody: Negative (07/07 0000) Rubella: Immune (07/07 0000) RPR: Non Reactive (10/03 0839)  HBsAg:    HIV: Non Reactive (10/03 0839)  GBS:    1 hr Glucola WNL Genetic screening  NIPS: Neg mat 21 Anatomy US Normal  Prenatal Transfer Tool  Maternal Diabetes: No Genetic Screening: Normal Maternal Ultrasounds/Referrals: Normal Fetal Ultrasounds or other Referrals:  Referred to Materal Fetal Medicine  Maternal Substance Abuse:  No Significant Maternal Medications:  None Significant Maternal Lab Results: None  No results found for this or any previous  visit (from the past 24 hour(s)).  Patient Active Problem List   Diagnosis Date Noted   Normal labor and delivery 03/05/2021   Supervision of high risk pregnancy, antepartum 10/21/2020   History of low birth weight 10/15/2020   History of VBAC 09/13/2013    Assessment/Plan:  Colleen Ballard is a 30 y.o. [id number] at [redacted]w[redacted]d here for SROM  #Labor: Expectant management. Repeat TOLAC after prior VBAC. #Pain: None at this time. Available epidural and IV upon request. #FWB: Cat 1 #ID:  GBS pending. Can give Penicillin once IV is placed #MOF: Breast #MOC: OCPs #Circ:  Female baby  Judieth Keens, Medical Student  03/05/2021, 2:52 AM  Patient was seen and assessed by me also I agree with the H&P listed here  S:  This is a 30 y.o. female at [redacted]w[redacted]d who presented in labor, active.  She was feeling pressure and found to be completely dilated  Care was received at the [facility] and then [facility] office O: AVSS      HR RRR, Resp unlabored      Abdomen gravid     FHR reactive, no decels      UCs q 2 min      Cervix completely dilated  A:  [place] IUP at [redacted]w[redacted]d        Preterm labor, second stage        Imminent delivery  P:  Admit to Labor and Delivery      Routine orders, except defer IV due to imminent delivery      IM Pitocin to be given after placenta       Anticipate SVD  Aviva Signs, CNM

## 2021-03-05 NOTE — Discharge Summary (Addendum)
Postpartum Discharge Summary    Patient Name: Colleen Ballard DOB: 1990-04-08 MRN: [id number]  Date of admission: 03/05/2021 Delivery date:03/05/2021  Delivering provider: Seabron Spates  Date of discharge: 03/06/2021  Admitting diagnosis: Normal labor and delivery [O80] Intrauterine pregnancy: [redacted]w[redacted]d    Secondary diagnosis:  Principal Problem:   Normal labor and delivery    Prior Cesarean Delivery followed by VBAC x 2  Additional problems: none    Discharge diagnosis: VBAC                                              Post partum procedures: none Augmentation: N/A Complications: None  Hospital course: Onset of Labor With Vaginal Delivery      30y.o. yo [id number] 320w4das admitted in Active Labor on 03/05/2021. Patient had an uncomplicated labor course as follows:  Membrane Rupture Time/Date: 12:00 AM ,03/05/2021   Labor was advanced and patient was ready to push on arrival to room. Delivered soon thereafter Delivery Method:VBAC, Spontaneous  Episiotomy: None  Lacerations:  None  Patient had an uncomplicated postpartum course.  She is ambulating, tolerating a regular diet, passing flatus, and urinating well. Patient is discharged home in stable condition on 03/06/21.  Newborn Data: Birth date:03/05/2021  Birth time:2:37 AM  Gender:Female  Living status:Living  Apgars:9 ,9  Weight:2780 g   Magnesium Sulfate received: No BMZ received: No Rhophylac:N/A MMR:N/A T-DaP:Given prenatally Flu: N/A Transfusion:No  Physical exam  Vitals:   03/05/21 1419 03/05/21 1800 03/05/21 2113 03/06/21 0509  BP: 98/67 97/63 101/68 92/63  Pulse: 81 89 84 74  Resp: _0 Temp: 97.9 F (36.6 C) 98.4 F (36.9 C) 98.1 F (36.7 C) 97.7 F (36.5 C)  TempSrc: Oral Oral Oral Oral  SpO2: 98%  99%   Weight:      Height:       General: alert, cooperative, and no distress Lochia: appropriate Uterine Fundus: firm DVT Evaluation: No evidence of DVT seen on physical  exam. Labs: Lab Results  Component Value Date   WBC 12.6 (H) 03/05/2021   HGB 11.0 (L) 03/05/2021   HCT 33.1 (L) 03/05/2021   MCV 90.9 03/05/2021   PLT 240 03/05/2021   No flowsheet data found. Edinburgh Score: Edinburgh Postnatal Depression Scale Screening Tool 03/05/2021  I have been able to laugh and see the funny side of things. 0  I have looked forward with enjoyment to things. 0  I have blamed myself unnecessarily when things went wrong. 0  I have been anxious or worried for no good reason. 0  I have felt scared or panicky for no good reason. 0  Things have been getting on top of me. 0  I have been so unhappy that I have had difficulty sleeping. 0  I have felt sad or miserable. 0  I have been so unhappy that I have been crying. 0  The thought of harming myself has occurred to me. 0  Edinburgh Postnatal Depression Scale Total 0      After visit meds:  Allergies as of 03/06/2021   No Active Allergies      Medication List     STOP taking these medications    Blood Pressure Monitoring Devi       TAKE these medications    acetaminophen 325 MG tablet Commonly known as: Tylenol Take  2 tablets (650 mg total) by mouth every 4 (four) hours as needed (for pain scale < 4).   ibuprofen 600 MG tablet Commonly known as: ADVIL Take 1 tablet (600 mg total) by mouth every 6 (six) hours.   prenatal multivitamin Tabs tablet Take 1 tablet by mouth at bedtime.        Follow up Visit: Message sent to [facility] by Dr. Cy Blamer on 12/8  Discharge home in stable condition Infant Feeding: Breast Infant Disposition:home with mother Discharge instruction: per After Visit Summary and Postpartum booklet. Activity: Advance as tolerated. Pelvic rest for 6 weeks.  Diet: routine diet Anticipated Birth Control: POPs Postpartum Appointment:6 weeks Additional Postpartum F/U:  none   Future Appointments: Future Appointments  Date Time Provider [place]  03/10/2021  3:15 PM  Renard Matter, MD [facility]       03/06/2021 Layla Barter, MD PGY-2  GME ATTESTATION:  I saw and evaluated the patient. I agree with the findings and the plan of care as documented in the resident's note and made all necessary edits.  Renard Matter, MD, MPH OB Fellow, [place] [facility] [place] 03/06/2021 9:19 AM

## 2021-03-05 NOTE — Lactation Note (Signed)
This note was copied from a baby's chart. Lactation Consultation Note  Patient Name: Colleen Ballard Today's Date: 03/05/2021 Reason for consult: Initial assessment;Late-preterm 34-36.6wks;1st time breastfeeding Age:30 hours  P3, [redacted]w[redacted]d.  First time breastfeeding.  Mother states she would like to breastfeed and formula feed. Reviewed hand expression with good flow.  Gave baby 3 ml on spoon and attempted to latch.  Baby sleepy. Set up DEBP.  Mother states she prefers to use a manual pump.  Both were demonstrated. Mother declined [facility] pump referral.   Mother states she is going to try using manual pump and hand expressing more volume to give to baby. Feed on demand with cues.  Goal 8-12+ times per day after first 24 hrs.  Place baby STS if not cueing.  Reviewed LPI feeding sheet. Mom made aware of O/P services, breastfeeding support groups, community resources, and our phone # for post-discharge questions.    Maternal Data Has patient been taught Hand Expression?: Yes Does the patient have breastfeeding experience prior to this delivery?: No  Feeding Mother's Current Feeding Choice: Breast Milk and Formula  LATCH Score Latch: Repeated attempts needed to sustain latch, nipple held in mouth throughout feeding, stimulation needed to elicit sucking reflex.  Audible Swallowing: None  Type of Nipple: Everted at rest and after stimulation  Comfort (Breast/Nipple): Soft / non-tender  Hold (Positioning): Assistance needed to correctly position infant at breast and maintain latch.  LATCH Score: 6   Lactation Tools Discussed/Used Tools: Pump Breast pump type: Double-Electric Breast Pump;Manual Pump Education: Setup, frequency, and cleaning;Milk Storage Reason for Pumping: stimulation and supplementation Pumping frequency:  (Recommend q 3hours)  Interventions Interventions: Assisted with latch;Skin to skin;Hand express;Hand pump;DEBP;Education;LC Services brochure (LPI  information sheet)  Discharge Pump: Manual (Mother declined [facility] referral)  Consult Status Consult Status: Follow-up Date: 03/06/21 Follow-up type: In-patient    [name] [facility] 03/05/2021, 10:19 AM

## 2021-03-06 ENCOUNTER — Ambulatory Visit: Payer: Self-pay

## 2021-03-06 MED ORDER — ACETAMINOPHEN 325 MG PO TABS
650.0000 mg | ORAL_TABLET | ORAL | 1 refills | Status: AC | PRN
Start: 2021-03-06 — End: ?

## 2021-03-06 MED ORDER — IBUPROFEN 600 MG PO TABS: 600.0000 mg | ORAL_TABLET | Freq: Four times a day (QID) | ORAL | 0 refills | Status: AC

## 2021-03-06 NOTE — Lactation Note (Signed)
This note was copied from a baby's chart. Lactation Consultation Note  Patient Name: Colleen Ballard Today's Date: 03/06/2021   Age:30 hours Per RN (Rachael) on Vocera , mom declined [facility] services tonight and would like to be seen by [facility] services in the morning.  Maternal Data    Feeding    LATCH Score                    Lactation Tools Discussed/Used    Interventions    Discharge    Consult Status      Danelle Earthly 03/06/2021, 10:29 PM

## 2021-03-07 LAB — CULTURE, BETA STREP (GROUP B ONLY): Strep Gp B Culture: NEGATIVE

## 2021-03-10 ENCOUNTER — Encounter: Payer: Medicaid Other | Admitting: Family Medicine

## 2021-03-19 ENCOUNTER — Telehealth (HOSPITAL_COMMUNITY): Payer: Self-pay

## 2021-03-19 NOTE — Telephone Encounter (Signed)
"  I'm good. Everything is good." Patient declines questions or concerns about her healing.  "She's fine. Eating and gaining weight well. She sleeps in her crib."  RN reviewed ABC's of safe sleep with patient. Patient declines any questions or concerns about baby.  EPDS score is 0.  Marcelino Duster [facility] 03/19/2021,1613

## 2021-04-17 ENCOUNTER — Ambulatory Visit (INDEPENDENT_AMBULATORY_CARE_PROVIDER_SITE_OTHER): Payer: Medicaid Other

## 2021-04-17 ENCOUNTER — Other Ambulatory Visit: Payer: Self-pay

## 2021-04-17 DIAGNOSIS — Z30011 Encounter for initial prescription of contraceptive pills: Secondary | ICD-10-CM

## 2021-04-17 LAB — POCT PREGNANCY, URINE: Preg Test, Ur: NEGATIVE

## 2021-04-17 MED ORDER — NORETHIN ACE-ETH ESTRAD-FE 1-20 MG-MCG(24) PO TABS
1.0000 | ORAL_TABLET | Freq: Every day | ORAL | 11 refills | Status: AC
Start: 2021-04-17 — End: ?

## 2021-04-17 NOTE — Progress Notes (Signed)
Post Partum Visit Note  Colleen Ballard is a 31 y.o. [phone] female who presents for a postpartum visit. She is 6 weeks postpartum following a normal spontaneous vaginal delivery.  I have fully reviewed the prenatal and intrapartum course. The delivery was at 104w4d gestational weeks.  Anesthesia: none. Postpartum course has been uncomplicated. Baby is doing well. Baby is feeding by bottle Rush Barer . Bleeding no bleeding. Bowel function is normal. Bladder function is normal. Patient is not sexually active. Contraception method is OCP (estrogen/progesterone). Postpartum depression screening: negative.   The pregnancy intention screening data noted above was reviewed. Potential methods of contraception were discussed. The patient elected to proceed with No data recorded.   Edinburgh Postnatal Depression Scale - 04/17/21 1106       Edinburgh Postnatal Depression Scale:  In the Past 7 Days   I have been able to laugh and see the funny side of things. 0    I have looked forward with enjoyment to things. 0    I have blamed myself unnecessarily when things went wrong. 0    I have been anxious or worried for no good reason. 0    I have felt scared or panicky for no good reason. 0    Things have been getting on top of me. 0    I have been so unhappy that I have had difficulty sleeping. 0    I have felt sad or miserable. 0    I have been so unhappy that I have been crying. 0    The thought of harming myself has occurred to me. 0    Edinburgh Postnatal Depression Scale Total 0             Health Maintenance Due  Topic Date Due   COVID-19 Vaccine (1) Never done   INFLUENZA VACCINE  Never done    The following portions of the patient's history were reviewed and updated as appropriate: allergies, current medications, past family history, past medical history, past social history, past surgical history, and problem list.  Review of Systems Pertinent items are noted in  HPI.  Objective:  BP 102/65    Pulse 75    Wt 151 lb 11.2 oz (68.8 kg)    LMP 06/22/2020 (Exact Date)    BMI 29.63 kg/m    General:  alert, cooperative, and no distress   Breasts:  normal  Lungs: Effort normal  Heart:  Regular rhythm  Abdomen: soft, non-tender; bowel sounds normal; no masses,  no organomegaly   Wound N/a  GU exam:  not indicated       Assessment:   1. Encounter for initial prescription of contraceptive pills - POCT urine pregnancy - Norethindrone Acetate-Ethinyl Estrad-FE (LOESTRIN 24 FE) 1-20 MG-MCG(24) tablet; Take 1 tablet by mouth daily.  Dispense: 28 tablet; Refill: 11  2. Postpartum care and examination - Normal postpartum exam.   Plan:   Essential components of care per ACOG recommendations:  1.  Mood and well being: Patient with negative depression screening today. Reviewed local resources for support.  - Patient tobacco use? No.   - hx of drug use? No.    2. Infant care and feeding:  -Patient currently breastmilk feeding? No.  -Social determinants of health (SDOH) reviewed in EPIC. No concerns  3. Sexuality, contraception and birth spacing - Patient does not want a pregnancy in the next year.  Desired family size is 3 children.  - Reviewed forms of contraception in tiered fashion.  Patient desired oral contraceptives (estrogen/progesterone) today.   - Discussed birth spacing of 18 months  4. Sleep and fatigue -Encouraged family/partner/community support of 4 hrs of uninterrupted sleep to help with mood and fatigue  5. Physical Recovery  - Discussed patients delivery and complications. She describes her labor as good. - Patient had a Vaginal, no problems at delivery. Patient had no lacerations. Patient expressed understanding - Patient has urinary incontinence? No. - Patient is safe to resume physical and sexual activity  6.  Health Maintenance - HM due items addressed Yes - Last pap smear No results found for: DIAGPAP Pap smear not done at  today's visit. - Last pap at [facility] 09/2020-NILM  -Breast Cancer screening indicated? No.   7. Chronic Disease/Pregnancy Condition follow up: None   Brand Males, CNM [facility] [employer], [facility]

## 2021-11-14 IMAGING — US US MFM OB DETAIL+14 WK
1 series · 13 of 28 positions shown · non-contrast
Comparison: none

[Series 1: us mfm ob detail+14 wk · 13 of 207 slices shown]
[im 8/207]
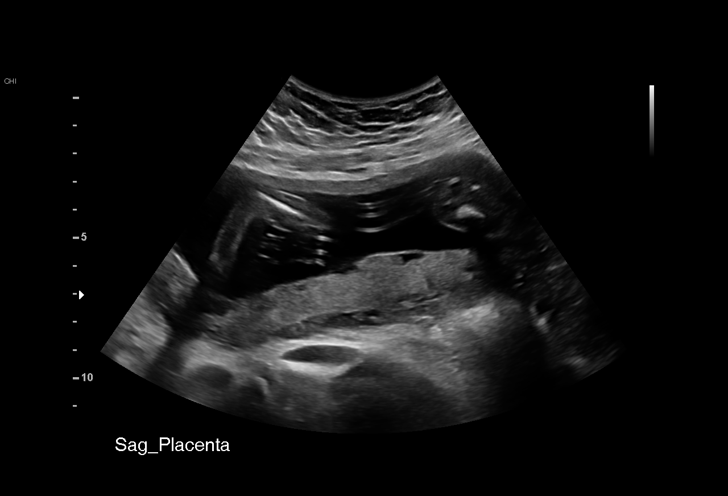
[im 23/207]
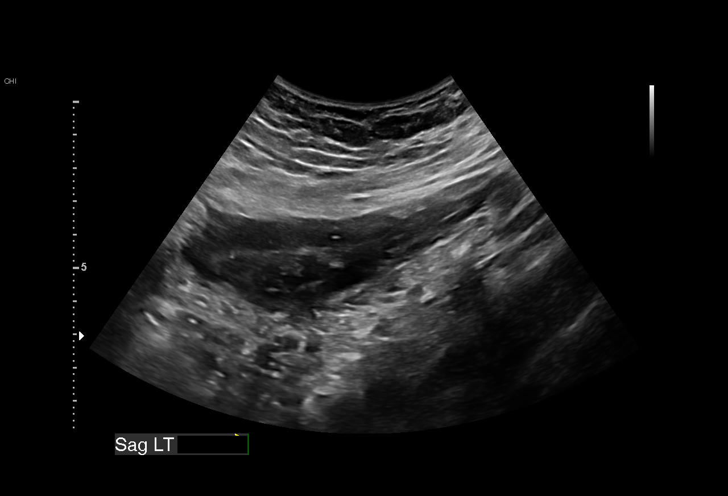
[im 39/207]
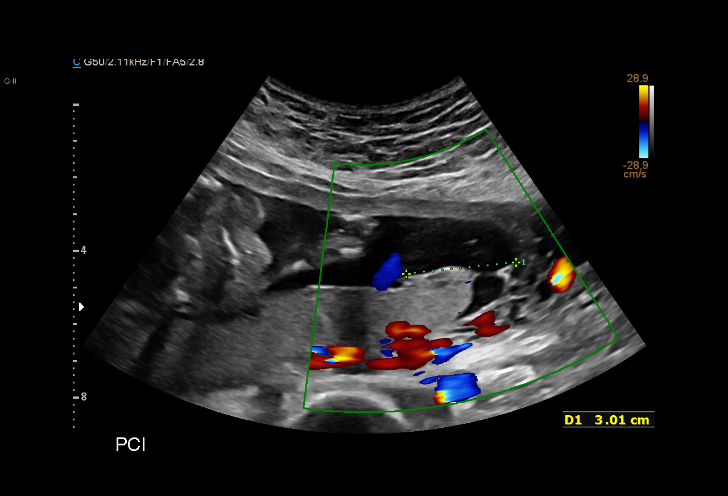
[im 54/207]
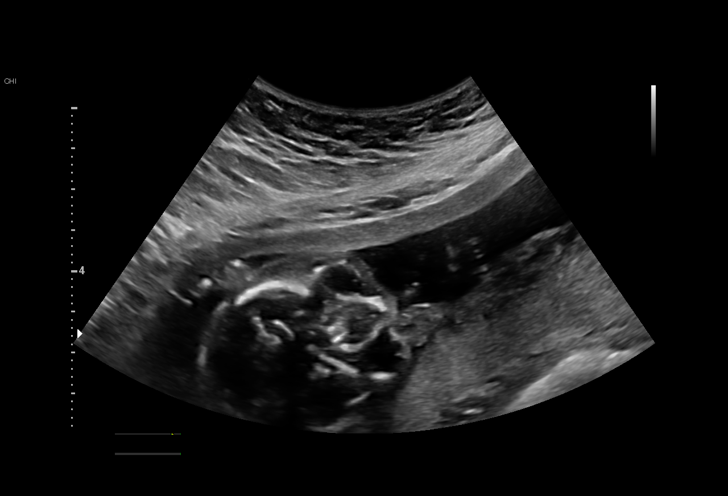
[im 69/207]
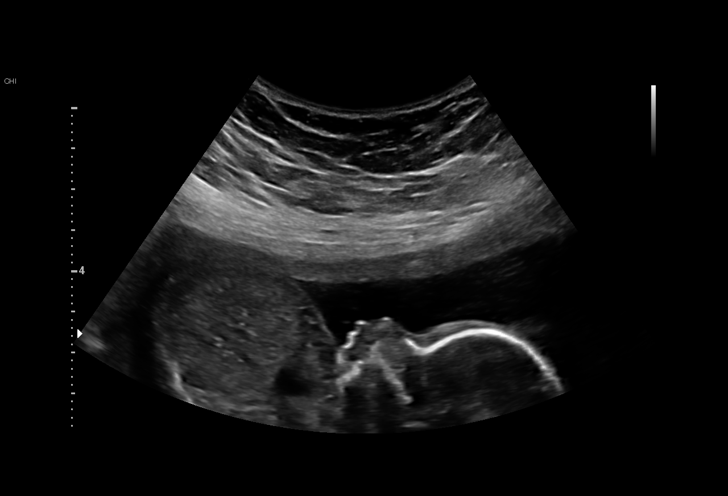
[im 84/207]
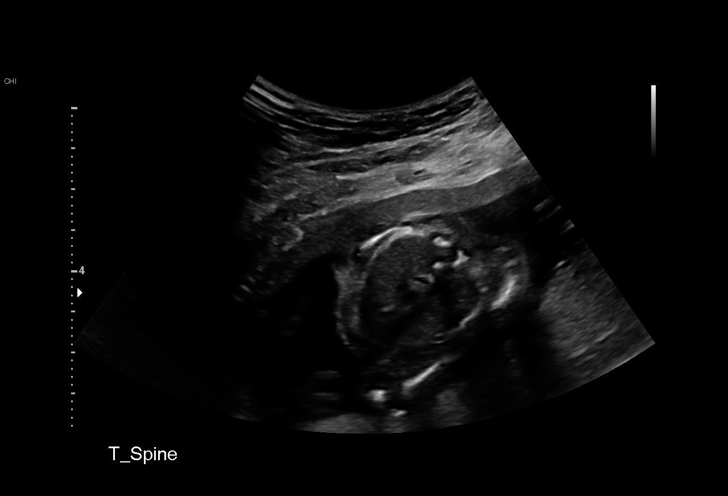
[im 107/207]
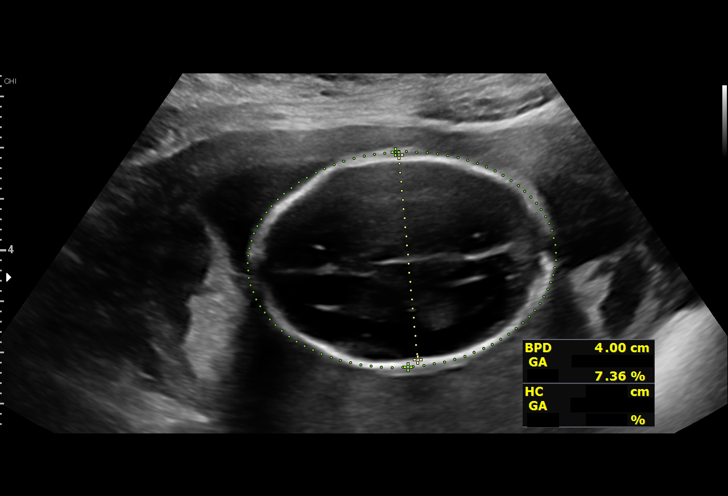
[im 123/207]
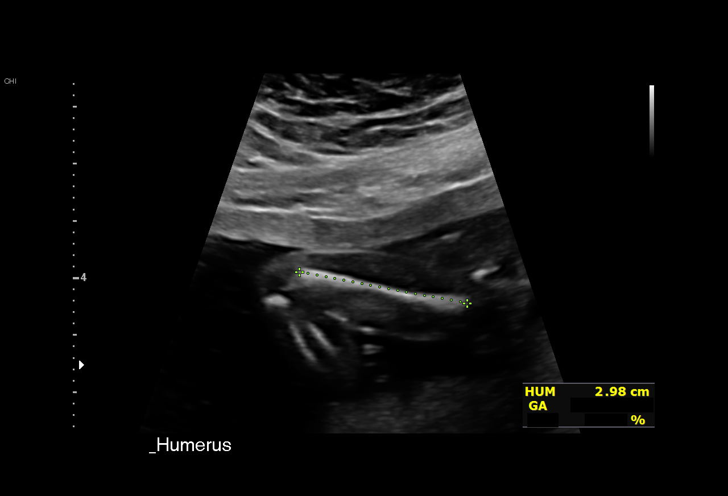
[im 138/207]
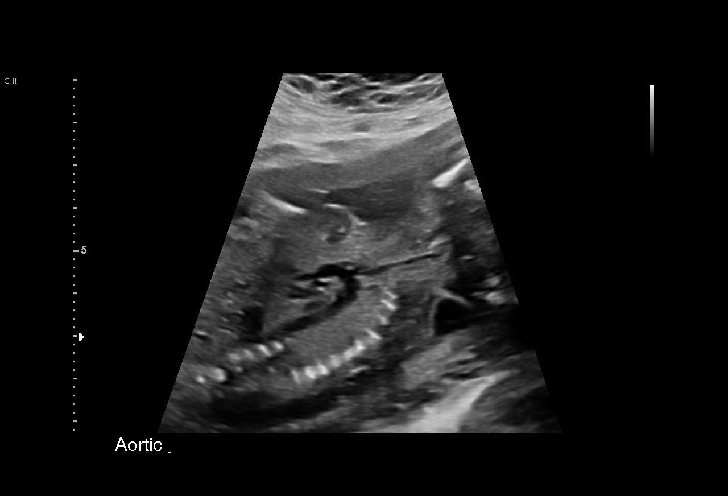
[im 153/207]
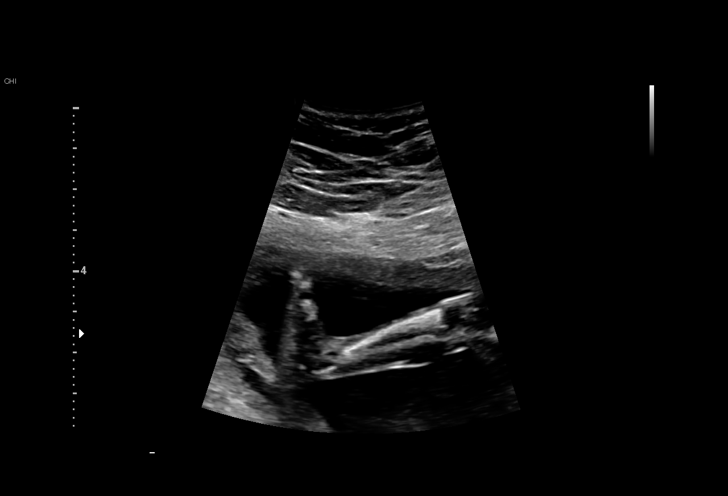
[im 168/207]
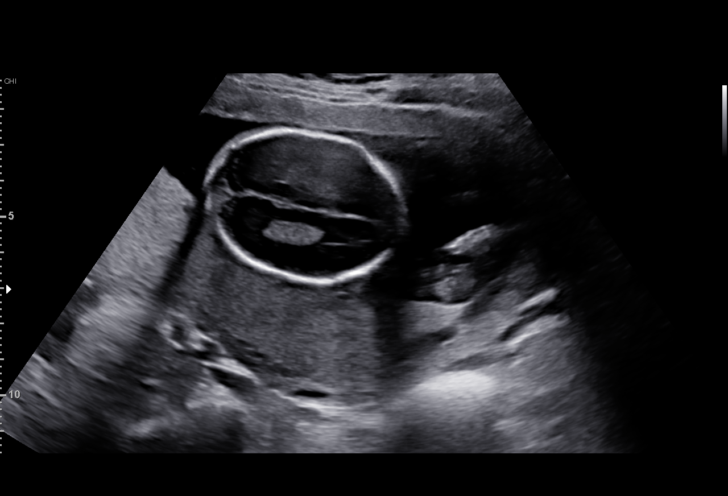
[im 184/207]
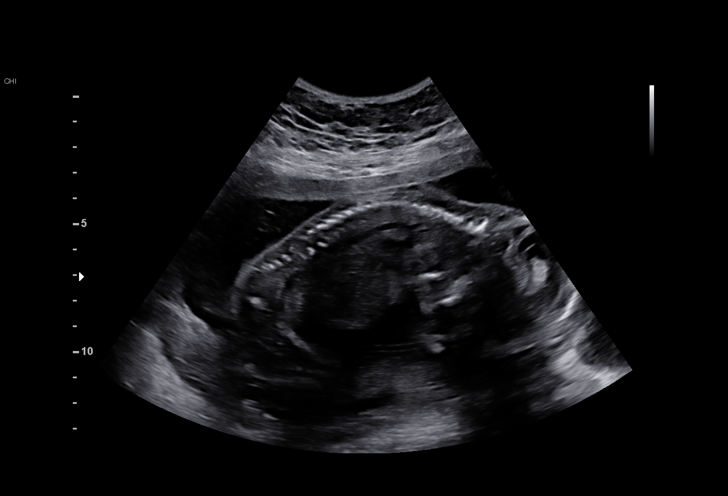
[im 199/207]
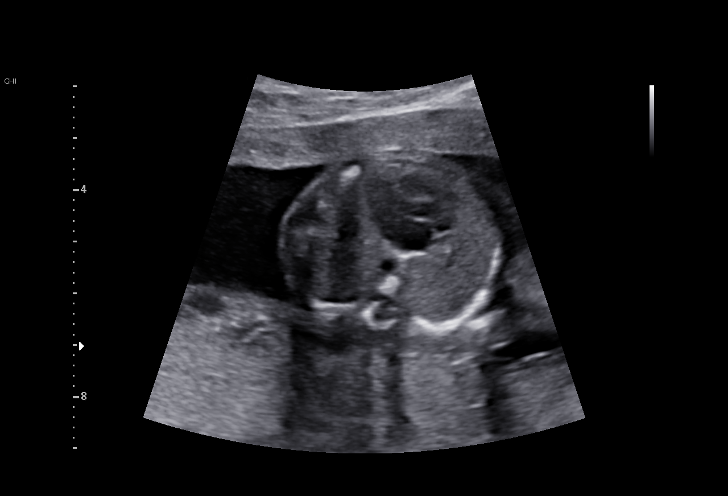

[13 of 28 positions shown; findings below may reference images not displayed]

JUBERG

Indications

 Previous cesarean delivery, antepartum
 Encounter for antenatal screening for
 malformations
 Poor obstetric history: Previous fetal growth
 restriction (FGR)
 Negative Quad Screen;  negative CF
 19 weeks gestation of pregnancy
Fetal Evaluation

 Num Of Fetuses:          1
 Fetal Heart Rate(bpm):   158
 Cardiac Activity:        Observed
 Presentation:            Variable
 Placenta:                Posterior
 P. Cord Insertion:       Visualized, central

 Amniotic Fluid
 AFI FV:      Within normal limits

                             Largest Pocket(cm)

Biometry

 BPD:      40.1  mm     G. Age:  18w 1d          8  %    CI:        63.37   %    70 - 86
                                                         FL/HC:       18.6  %    16.1 -
 HC:      162.3  mm     G. Age:  19w 0d         23  %    HC/AC:       1.06       1.09 -
 AC:      152.7  mm     G. Age:  20w 3d         78  %    FL/BPD:      75.3  %
 FL:       30.2  mm     G. Age:  19w 2d         39  %    FL/AC:       19.8  %    20 - 24
 HUM:      29.7  mm     G. Age:  19w 5d         59  %
 CER:      19.4  mm     G. Age:  18w 6d         33  %
 NFT:       3.0  mm
 LV:        5.7  mm
 CM:        5.2  mm

 Est. FW:     313   gm   0 lb 11 oz      67  %
OB History

 Gravidity:    3         Term:   2
 Living:       2
Gestational Age

 LMP:           19w 3d        Date:  06/22/20                 EDD:   03/29/21
 U/S Today:     19w 2d                                        EDD:   03/30/21
 Best:          19w 3d     Det. By:  LMP  (06/22/20)          EDD:   03/29/21
Anatomy

 Cranium:               Appears normal         LVOT:                   Appears normal
 Cavum:                 Appears normal         Aortic Arch:            Appears normal
 Ventricles:            Appears normal         Ductal Arch:            Appears normal
 Choroid Plexus:        Appears normal         Diaphragm:              Appears normal
 Cerebellum:            Appears normal         Stomach:                Appears normal, left
                                                                       sided
 Posterior Fossa:       Appears normal         Abdomen:                Appears normal
 Nuchal Fold:           Appears normal         Abdominal Wall:         Appears nml (cord
                                                                       insert, abd wall)
 Face:                  Appears normal         Cord Vessels:           Appears normal (3
                        (orbits and profile)                           vessel cord)
 Lips:                  Appears normal         Kidneys:                Appear normal
 Palate:                Appears normal         Bladder:                Appears normal
 Thoracic:              Appears normal         Spine:                  Appears normal
 Heart:                 Appears normal         Upper Extremities:      Appears normal
                        (4CH, axis, and
                        situs)
 RVOT:                  Appears normal         Lower Extremities:      Appears normal

 Other:  Nasal bone and lenses visualized. Heels/feet and open hands
         visualized. VC, 3VV and 3VTV visualized.
Cervix Uterus Adnexa

 Cervix
 Length:           3.01  cm.
 Normal appearance by transabdominal scan.

 Uterus
 No abnormality visualized.

 Right Ovary
 Within normal limits.

 Left Ovary
 Within normal limits.

 Cul De Sac
 No free fluid seen.
 Adnexa
 No abnormality visualized.
Comments

 This patient was seen for a detailed fetal anatomy scan due
 to IUGR in her prior pregnancy.  She reports that her prior
 child was delivered at term weighing about 5-1/2 pounds.
 She denies any other significant past medical history and
 denies any problems in her current pregnancy.
 She has not had any screening tests for fetal aneuploidy
 drawn in her current pregnancy.
 She was informed that the fetal growth and amniotic fluid
 level were appropriate for her gestational age.
 There were no obvious fetal anomalies noted on today's
 ultrasound exam.
 The patient was informed that anomalies may be missed due
 to technical limitations. If the fetus is in a suboptimal position
 or maternal habitus is increased, visualization of the fetus in
 the maternal uterus may be impaired.
 As the patient has not had any screening tests for fetal
 aneuploidy drawn, she had a cell free DNA test (Materni T21)
 drawn following today's ultrasound exam.
 Due to her history of fetal growth restriction, a follow-up
 growth scan was scheduled in 8 weeks.

## 2022-02-20 IMAGING — US US MFM OB FOLLOW-UP
1 series · 14 of 26 positions shown · non-contrast
Comparison: none

[Series 1: us mfm ob follow-up · 14 of 26 slices shown]
[im 1/26]
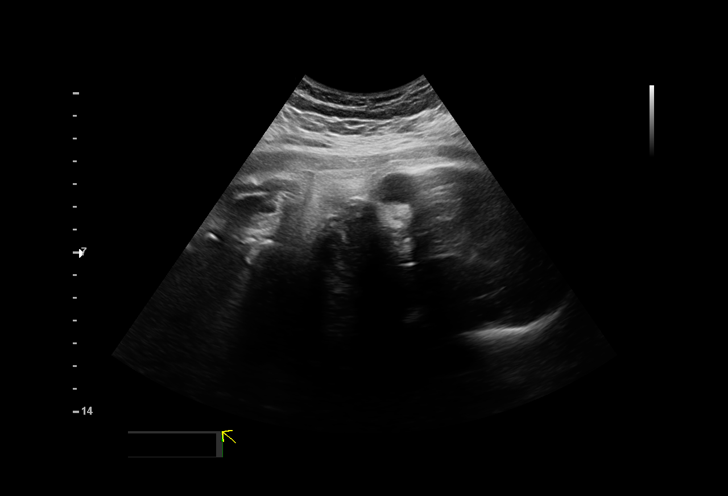
[im 3/26]
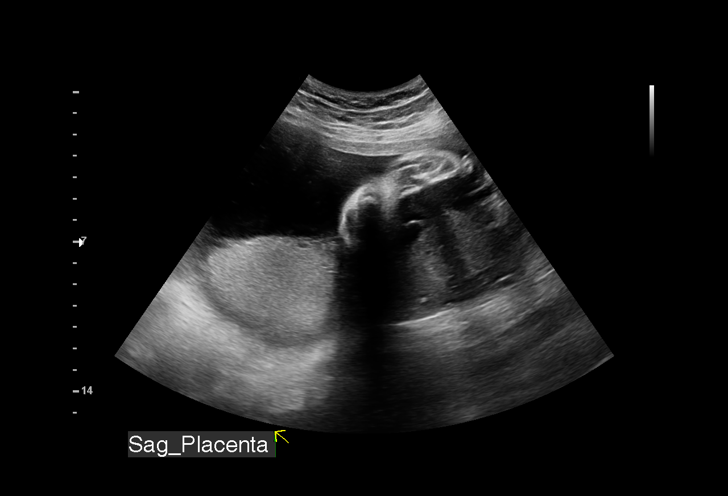
[im 5/26]
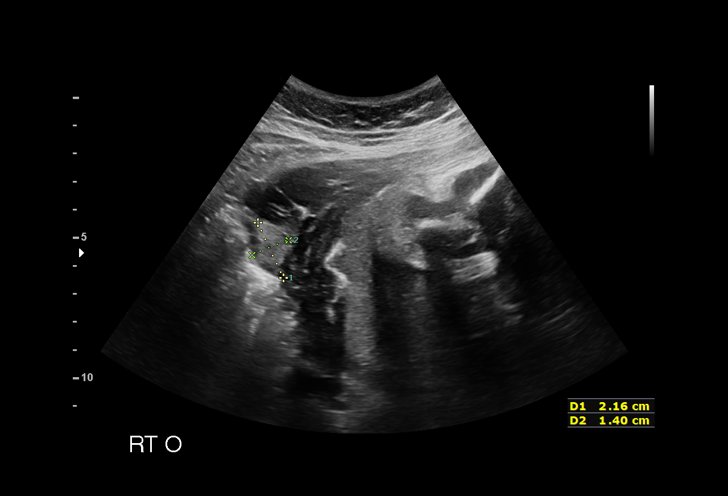
[im 7/26]
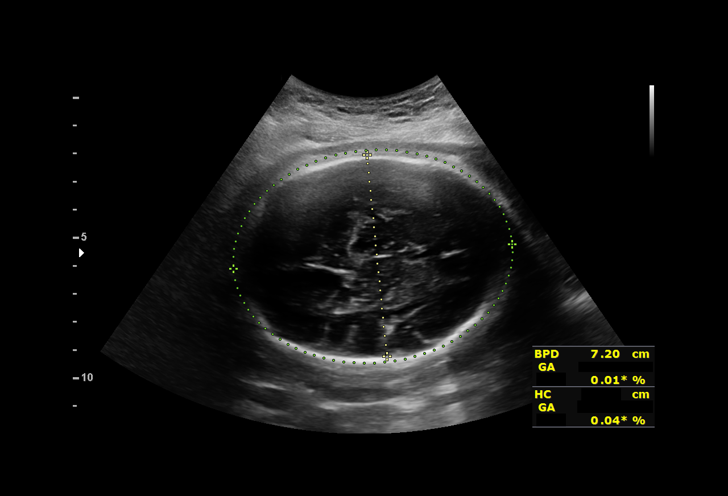
[im 9/26]
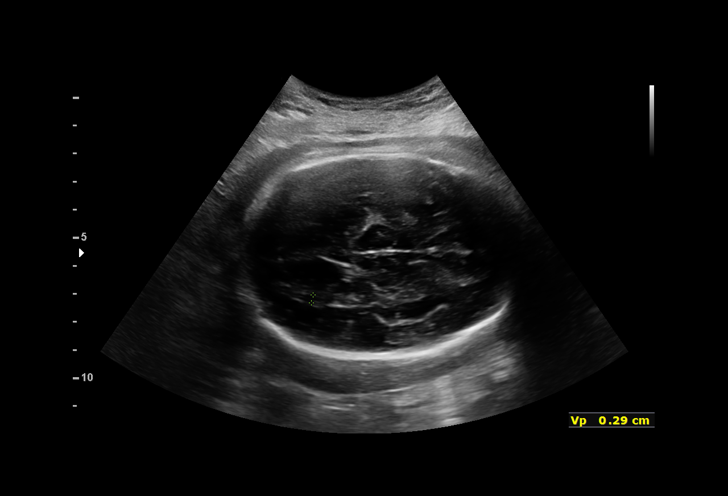
[im 11/26]
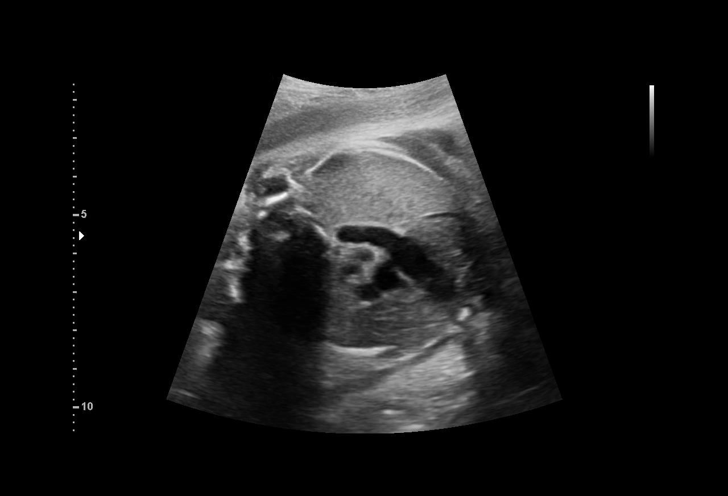
[im 13/26]
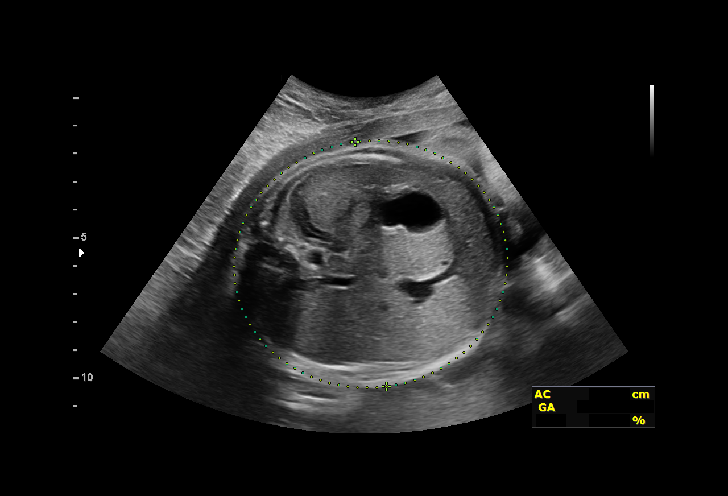
[im 14/26]
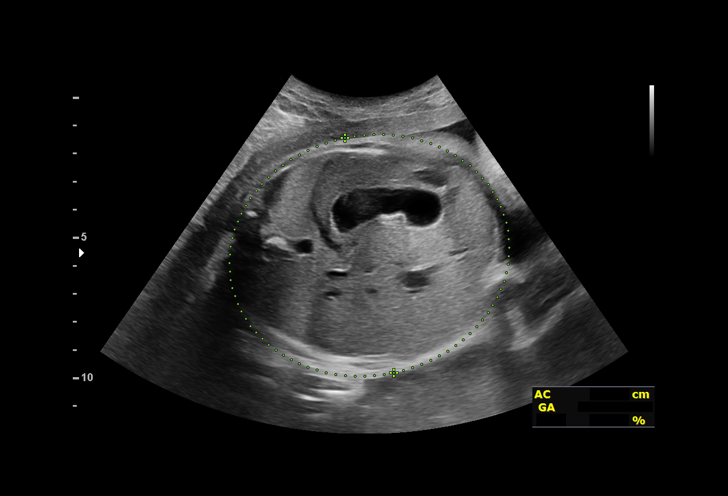
[im 16/26]
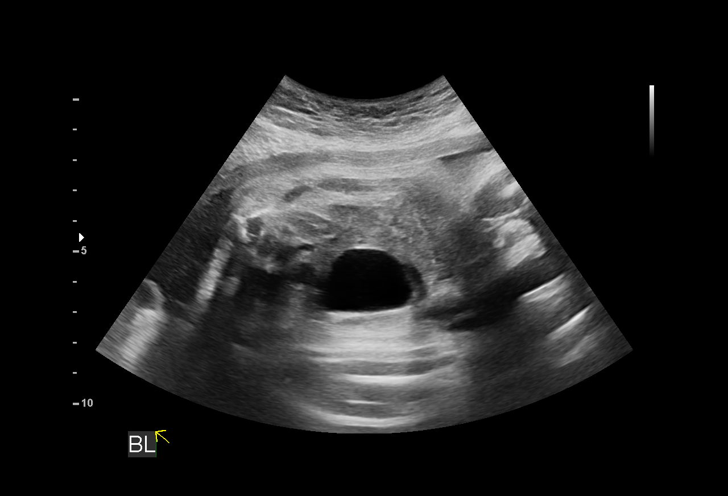
[im 18/26]
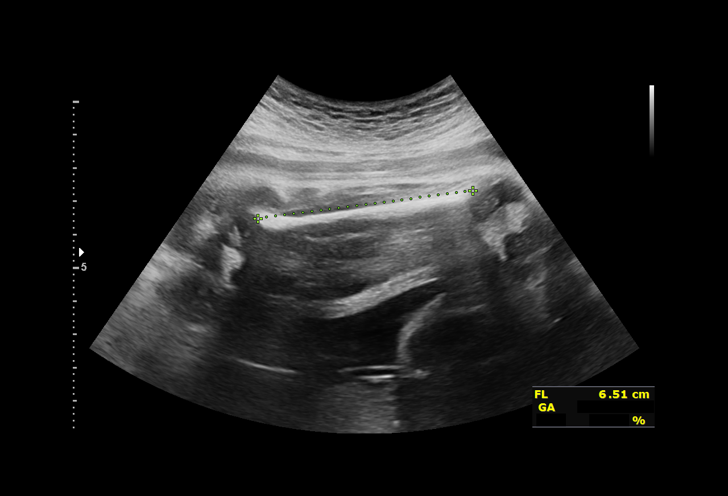
[im 20/26]
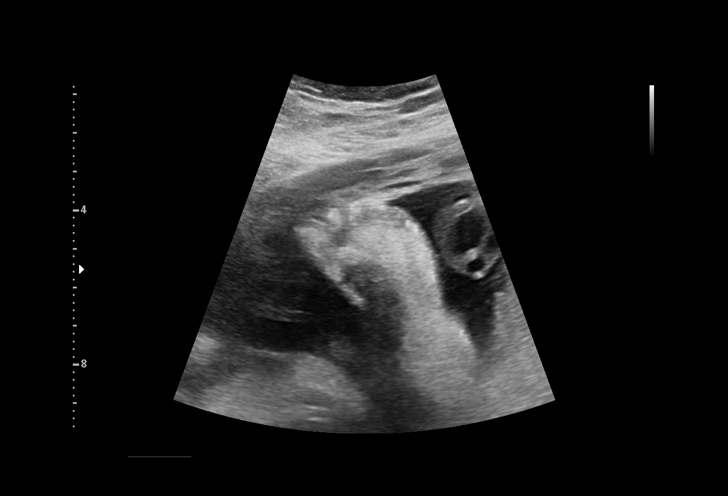
[im 22/26]
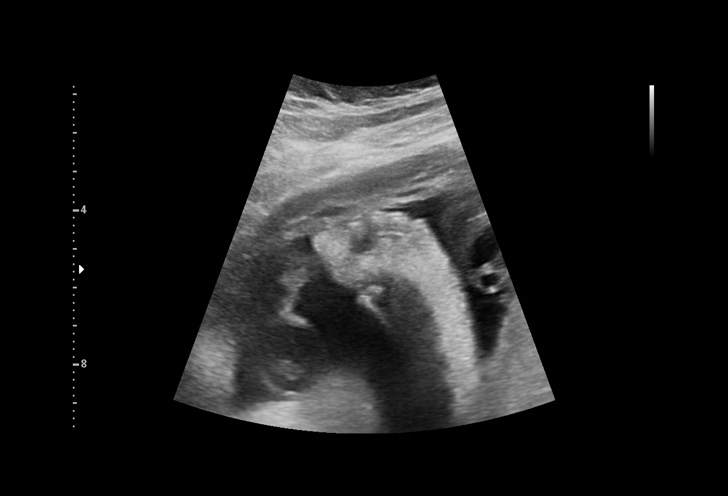
[im 24/26]
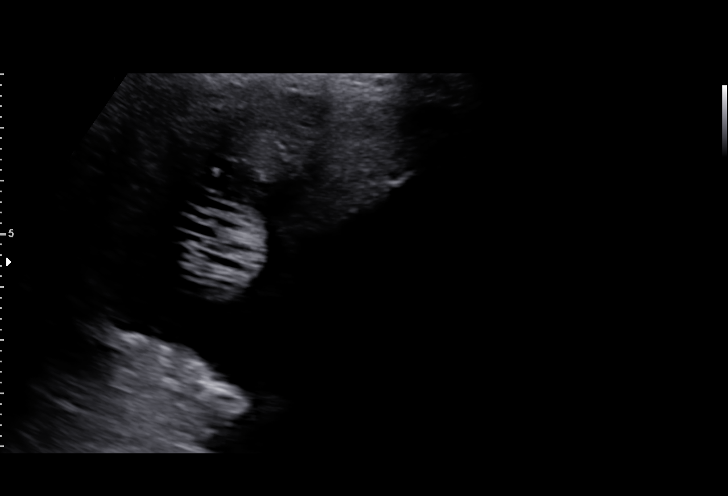
[im 26/26]
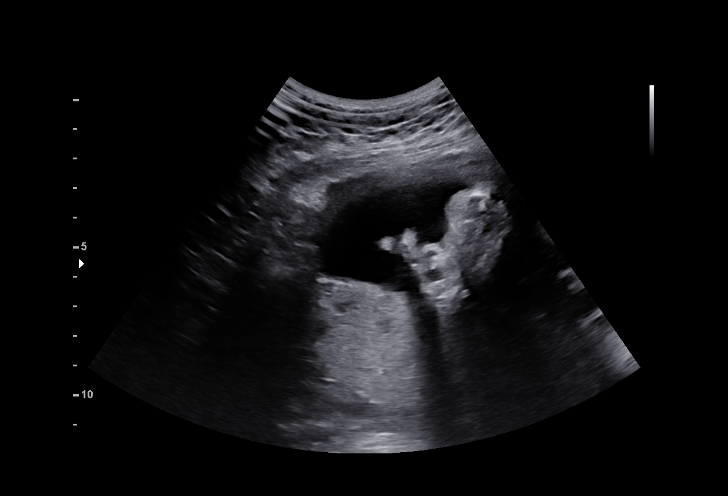

[14 of 26 positions shown; findings below may reference images not displayed]

JEY


                                                      FERIENHAUS

Indications

 Previous cesarean delivery, antepartum
 Poor obstetric history: Previous fetal growth
 restriction (FGR)
 Negative Quad Screen;  negative CF
 33 weeks gestation of pregnancy
Fetal Evaluation

 Num Of Fetuses:         1
 Fetal Heart Rate(bpm):  140
 Cardiac Activity:       Observed
 Placenta:               Posterior
 P. Cord Insertion:      Previously Visualized

 Amniotic Fluid
 AFI FV:      Within normal limits

 AFI Sum(cm)     %Tile       Largest Pocket(cm)
 16.36           59

 RUQ(cm)       RLQ(cm)       LUQ(cm)        LLQ(cm)

Biometry

 BPD:      72.1  mm     G. Age:  29w 0d        < 1  %    CI:        67.74   %    70 - 86
                                                         FL/HC:      23.0   %    19.9 -
 HC:      280.3  mm     G. Age:  30w 5d        < 1  %    HC/AC:      0.96        0.96 -
 AC:      291.8  mm     G. Age:  33w 1d         44  %    FL/BPD:     89.6   %    71 - 87
 FL:       64.6  mm     G. Age:  33w 2d         36  %    FL/AC:      22.1   %    20 - 24
 LV:        2.9  mm
 Est. FW:    2111  gm      4 lb 7 oz     19  %
OB History

 Gravidity:    3         Term:   2
 Living:       2
Gestational Age

 LMP:           33w 3d        Date:  06/22/20                 EDD:   03/29/21
 U/S Today:     31w 4d                                        EDD:   04/11/21
 Best:          33w 3d     Det. By:  LMP  (06/22/20)          EDD:   03/29/21
Anatomy

 Cranium:               Appears normal         LVOT:                   Previously seen
 Cavum:                 Previously seen        Aortic Arch:            Previously seen
 Ventricles:            Appears normal         Ductal Arch:            Previously seen
 Choroid Plexus:        Previously seen        Diaphragm:              Previously seen
 Cerebellum:            Previously seen        Stomach:                Appears normal, left
                                                                       sided
 Posterior Fossa:       Previously seen        Abdomen:                Previously seen
 Nuchal Fold:           Not applicable (>20    Abdominal Wall:         Previously seen
                        wks GA)
 Face:                  Orbits and profile     Cord Vessels:           Previously seen
                        previously seen
 Lips:                  Previously seen        Kidneys:                Appear normal
 Palate:                Previously seen        Bladder:                Appears normal
 Thoracic:              Previously seen        Spine:                  Previously seen
 Heart:                 Appears normal         Upper Extremities:      Previously seen
                        (4CH, axis, and
                        situs)
 RVOT:                  Previously seen        Lower Extremities:      Previously seen

 Other:  Nasal bone and lenses previously visualized. Heels/feet and open
         hands previously visualized. VC, 3VV and 3VTV previously visualized.
Cervix Uterus Adnexa

 Cervix
 Not visualized (advanced GA >90wks)

 Right Ovary
 Visualized.

 Left Ovary
 Visualized.
Comments

 This patient was seen for a follow up growth scan due to a
 history of fetal growth restriction in her prior pregnancies.
 She denies any problems since her last exam.
 She was informed that the fetal growth and amniotic fluid
 level appears appropriate for her gestational age.
 As the fetal growth is within normal limits, no further exams
 were scheduled in our office.
